# Patient Record
Sex: Male | Born: 1980 | Race: White | Hispanic: No | Marital: Married | Smoking: Former smoker
Health system: Southern US, Community
[De-identification: ages and names within clinical notes are randomized; demographics above are authoritative.]

## PROBLEM LIST (undated history)

## (undated) DIAGNOSIS — E039 Hypothyroidism, unspecified: Secondary | ICD-10-CM

## (undated) DIAGNOSIS — F419 Anxiety disorder, unspecified: Secondary | ICD-10-CM

## (undated) DIAGNOSIS — E291 Testicular hypofunction: Secondary | ICD-10-CM

## (undated) DIAGNOSIS — E063 Autoimmune thyroiditis: Secondary | ICD-10-CM

## (undated) DIAGNOSIS — K449 Diaphragmatic hernia without obstruction or gangrene: Secondary | ICD-10-CM

## (undated) HISTORY — PX: OTHER SURGICAL HISTORY: SHX169

## (undated) HISTORY — DX: Testicular hypofunction: E29.1

## (undated) HISTORY — DX: Hypothyroidism, unspecified: E03.9

---

## 2002-11-09 ENCOUNTER — Emergency Department (HOSPITAL_COMMUNITY): Admission: EM | Admit: 2002-11-09 | Discharge: 2002-11-09 | Payer: Self-pay | Admitting: Emergency Medicine

## 2004-10-19 ENCOUNTER — Emergency Department (HOSPITAL_COMMUNITY): Admission: EM | Admit: 2004-10-19 | Discharge: 2004-10-19 | Payer: Self-pay | Admitting: *Deleted

## 2011-07-25 HISTORY — PX: WISDOM TOOTH EXTRACTION: SHX21

## 2011-09-15 ENCOUNTER — Ambulatory Visit
Admission: RE | Admit: 2011-09-15 | Discharge: 2011-09-15 | Disposition: A | Discharge status: Home / Self Care | Payer: BC Managed Care – PPO | Source: Ambulatory Visit | Attending: Neurology | Admitting: Neurology

## 2011-09-15 ENCOUNTER — Other Ambulatory Visit: Payer: Self-pay | Admitting: Neurology

## 2011-09-15 DIAGNOSIS — M542 Cervicalgia: Secondary | ICD-10-CM

## 2012-03-15 ENCOUNTER — Other Ambulatory Visit (HOSPITAL_COMMUNITY): Payer: Self-pay | Admitting: "Endocrinology

## 2012-03-15 ENCOUNTER — Ambulatory Visit (HOSPITAL_COMMUNITY)
Admission: RE | Admit: 2012-03-15 | Discharge: 2012-03-15 | Disposition: A | Discharge status: Home / Self Care | Payer: BC Managed Care – PPO | Source: Ambulatory Visit | Attending: "Endocrinology | Admitting: "Endocrinology

## 2012-03-15 DIAGNOSIS — N5089 Other specified disorders of the male genital organs: Secondary | ICD-10-CM

## 2012-03-15 DIAGNOSIS — N508 Other specified disorders of male genital organs: Secondary | ICD-10-CM | POA: Insufficient documentation

## 2013-09-10 ENCOUNTER — Encounter (HOSPITAL_COMMUNITY): Payer: Self-pay | Admitting: Pharmacy Technician

## 2013-09-10 ENCOUNTER — Ambulatory Visit (INDEPENDENT_AMBULATORY_CARE_PROVIDER_SITE_OTHER): Payer: BC Managed Care – PPO | Admitting: Gastroenterology

## 2013-09-10 ENCOUNTER — Encounter (INDEPENDENT_AMBULATORY_CARE_PROVIDER_SITE_OTHER): Payer: Self-pay

## 2013-09-10 ENCOUNTER — Encounter: Payer: Self-pay | Admitting: Gastroenterology

## 2013-09-10 ENCOUNTER — Ambulatory Visit: Payer: BC Managed Care – PPO | Admitting: Gastroenterology

## 2013-09-10 VITALS — BP 115/63 | HR 79 | Temp 98.2°F | Ht 75.0 in | Wt 172.2 lb

## 2013-09-10 DIAGNOSIS — R634 Abnormal weight loss: Secondary | ICD-10-CM

## 2013-09-10 DIAGNOSIS — K625 Hemorrhage of anus and rectum: Secondary | ICD-10-CM

## 2013-09-10 DIAGNOSIS — K219 Gastro-esophageal reflux disease without esophagitis: Secondary | ICD-10-CM | POA: Insufficient documentation

## 2013-09-10 DIAGNOSIS — R198 Other specified symptoms and signs involving the digestive system and abdomen: Secondary | ICD-10-CM

## 2013-09-10 DIAGNOSIS — R6881 Early satiety: Secondary | ICD-10-CM

## 2013-09-10 DIAGNOSIS — K5900 Constipation, unspecified: Secondary | ICD-10-CM

## 2013-09-10 MED ORDER — PEG 3350-KCL-NA BICARB-NACL 420 G PO SOLR: 4000.0000 mL | ORAL | Status: DC

## 2013-09-10 NOTE — Progress Notes (Signed)
Primary Care Physician:  No primary provider on file.  Primary Gastroenterologist:  Roetta SessionsMichael Rourk, MD   Chief Complaint  Patient presents with  . Gastrophageal Reflux  . Constipation  . Weight Loss    HPI:  Johnny Wang is a 33 y.o. male here as new patient referral by Dr. Fransico HimNida, for chronic constipation and weight loss. Weighed 181 pounds at Dr. Isidoro DonningNida's on 08/13/13. Weighed 227 pounds four years ago. Today down to 172.  Four years ago multiple steed being. He required Solu-Medrol. Was on antibiotic at the same time as well. States he had a reaction. Describes feeling like he was on a "bad drug trip" for 3-4 days. Describes hallucinations and vomiting. Ever since then has had problems with losing weight, anxiety. States he was given contaminated Solu-Medrol. Since that time he had transient hypogonadism. Reports his testosterone level improved on its own. Recently had a bunch of labs done by homeopathic provider and was determined to have Hashimoto's thyroiditis. He went back to see Dr. Fransico HimNida and subsequently has been started on low-dose Synthroid. Again his weight is down 9 pounds since that time.   Symptomatically he complains of PP early satiety, heartburn (especially at lunch and supper). He feels like he doesn't digest his food very well. He describes himself as an extreme health food fanatic. He states he has always been that way never been a junkfood eater. He works out several times per week. States he's not able to gain any weight or muscle mass. Feels fatigued and weak all the time. He has been on a gluten-free/dairy free diet for about 2 weeks. He has noticed that his bowel movements have become more regular, chronically constipated usually.  Last four days good BM. Prior BM every 3-4 days. No melena, brbpr. Except with straining/paper. No dysphagia. Probiotics didn't help. Complains of left upper quadrant discomfort, empty feeling. Complains of terrible heartburn, TUMS to make it worse. He  has a lot of gas. Also wonders if symptoms are related to a trip to [place]. States symptoms seem to have started several weeks afterwards.    Current Outpatient Prescriptions  Medication Sig Dispense Refill  . levothyroxine (SYNTHROID, LEVOTHROID) 25 MCG tablet Take 25 mcg by mouth daily before breakfast.        No current facility-administered medications for this visit.    Allergies as of 09/10/2013  . (No Known Allergies)  solumedrol: infection/meningitis  Past Medical History  Diagnosis Date  . Hypothyroidism   . Hypogonadism, male     Past Surgical History  Procedure Laterality Date  . None      Family History  Problem Relation Age of Onset  . Colon cancer Neg Hx   . Inflammatory bowel disease Neg Hx   . Celiac disease Neg Hx     History   Social History  . Marital Status: Single    Spouse Name: N/A    Number of Children: 1  . Years of Education: N/A   Occupational History  . sales    Social History Main Topics  . Smoking status: Never Smoker   . Smokeless tobacco: Not on file  . Alcohol Use: No  . Drug Use: No  . Sexual Activity: Not on file   Other Topics Concern  . Not on file   Social History Narrative  . No narrative on file      ROS:  General: Negative for anorexia, fever, chills. See hpi. Eyes: Negative for vision changes.  ENT: Negative  for hoarseness, difficulty swallowing , nasal congestion. CV: Negative for chest pain, angina, palpitations, dyspnea on exertion, peripheral edema.  Respiratory: Negative for dyspnea at rest, dyspnea on exertion, cough, sputum, wheezing.  GI: See history of present illness. GU:  Negative for dysuria, hematuria, urinary incontinence, urinary frequency, nocturnal urination.  MS: Negative for joint pain, low back pain.  Derm: Negative for rash or itching.  Neuro: Negative for weakness, abnormal sensation, seizure, frequent headaches, memory loss, confusion.  Psych: c/o anxiety, depression. no suicidal  ideation, hallucinations. Feels like people think he is going crazy.  Endo: see hpi  Heme: Negative for bruising or bleeding. Allergy: Negative for rash or hives.    Physical Examination:  BP 115/63  Pulse 79  Temp(Src) 98.2 F (36.8 C) (Oral)  Ht 6\' 3"  (1.905 m)  Wt 172 lb 3.2 oz (78.109 kg)  BMI 21.52 kg/m2   General: Well-nourished, well-developed in no acute distress.  Head: Normocephalic, atraumatic.   Eyes: Conjunctiva pink, no icterus. Mouth: Oropharyngeal mucosa moist and pink , no lesions erythema or exudate. Neck: Supple without thyromegaly, masses, or lymphadenopathy.  Lungs: Clear to auscultation bilaterally.  Heart: Regular rate and rhythm, no murmurs rubs or gallops.  Abdomen: Bowel sounds are normal, nontender, nondistended, no hepatosplenomegaly or masses, no abdominal bruits or    hernia , no rebound or guarding.   Rectal: not performed Extremities: No lower extremity edema. No clubbing or deformities.  Neuro: Alert and oriented x 4 , grossly normal neurologically.  Skin: Warm and dry, no rash or jaundice.   Psych: Alert and cooperative, normal mood and affect.  Labs: Fasting a.m. cortisol level 16, normal  Imaging Studies: No results found.

## 2013-09-10 NOTE — Patient Instructions (Signed)
1. Upper endoscopy and colonoscopy with Dr. Jena Gaussourk. Please see separate instructions. 2. Please get a copy of your labs to us as soon as possible. Fax number [phone].

## 2013-09-11 ENCOUNTER — Telehealth: Payer: Self-pay | Admitting: *Deleted

## 2013-09-11 NOTE — Progress Notes (Signed)
Patient brought in a copy of his labs dated 08/01/2013 Glucose 92, hemoglobin A1c 5.2, uric acid 5.3, BUN 17, creatinine 1.08, sodium 138, calcium 9.8, total protein 6.6, albumin 5, globulin 1.6, total bilirubin 0.4, alkaline phosphatase 64, LDH 142, AST 16, ALT 14, GGT 11, iron 109, TIBC 234, iron saturations 47%, ferritin 406, white blood cell count 4800, hemoglobin 16.4, MCV 89, platelets 201,000, TSH 4.41, T4 thyroxine 6.8, free T4-1.07, TPO antibodies 28, thyroglobulin antibody 1.5, vitamin B12 550,  Vitamin D 25-28, CRP 0.21, homocystine 8.3.  

## 2013-09-11 NOTE — Assessment & Plan Note (Addendum)
33 year old gentleman with complicated medical history with multiple symptoms which have yet to be explained. Most impressive is that of a 50 pound weight loss over the past 4 years. He has lost 10 pounds since last month alone. GI symptoms consist of early satiety, chronic constipation, left upper quadrant discomfort, significant heartburn.   History of transient hypogonadism as outlined above. Most recently determined to have Hashimoto's thyroiditis and put on low dose Synthroid last month. History of vitamin D deficiency as well. He describes himself as a extremely healthy eater but denies any significant dietary changes over the course of time in which he has been losing weight. He complains of fatigue. States the symptoms have provoked significant anxiety for him. He was able to get a copy of his labs to us which indicated multiple abnormalities including low normal total protein, high normal iron saturation and high normal ferritin, low normal white blood cell count.  GI evaluation to consist of EGD and colonoscopy at this time for UGI symptoms, weight loss, bowel issues.  I have discussed the risks, alternatives, benefits with regards to but not limited to the risk of reaction to medication, bleeding, infection, perforation and the patient is agreeable to proceed. Written consent to be obtained.  We will check celiac serologies before he gets too far into gluten free diet. Along with that, we will check his LFT, ferritin, iron/tibc fasting, CBC. Further testing as indicated. If nothing to explain weight loss, he will required CT A/P, CXR as next step. Patient wanted to hold on PPI until after procedures.

## 2013-09-11 NOTE — Telephone Encounter (Signed)
Spoke with Johnny Wang- explained to him that most stool studies are only done if the patient is having diarrhea. Johnny Wang is concerned about hpylori and gardia and wanted to know if that was something we could test him for prior to him having procedures. Or any other stool studies that he can do, he doesn't mind doing them.

## 2013-09-11 NOTE — Telephone Encounter (Signed)
Pt called wanting to see about getting a stool sample done first before his colonoscopy, pt's TCS is march 4th with [facility], pt said he don't want to have a colonoscopy if he don't have too, please advise [phone]

## 2013-09-11 NOTE — Progress Notes (Signed)
Please let pt know we need him to get some more labs. Needs to be fasting!.Johnny Wang

## 2013-09-12 ENCOUNTER — Ambulatory Visit: Admit: 2013-09-12 | Payer: Self-pay | Admitting: Gastroenterology

## 2013-09-12 SURGERY — COLONOSCOPY
Anesthesia: Moderate Sedation

## 2013-09-17 NOTE — Progress Notes (Signed)
No pcp

## 2013-09-19 NOTE — Progress Notes (Signed)
Pt aware, lab orders faxed to the lab.

## 2013-09-22 LAB — CBC WITH DIFFERENTIAL/PLATELET
BASOS ABS: 0 10*3/uL (ref 0.0–0.1)
Basophils Relative: 1 % (ref 0–1)
EOS ABS: 0.1 10*3/uL (ref 0.0–0.7)
EOS PCT: 2 % (ref 0–5)
HCT: 44.3 % (ref 39.0–52.0)
Hemoglobin: 15.5 g/dL (ref 13.0–17.0)
Lymphocytes Relative: 33 % (ref 12–46)
Lymphs Abs: 1.2 10*3/uL (ref 0.7–4.0)
MCH: 31.3 pg (ref 26.0–34.0)
MCHC: 35 g/dL (ref 30.0–36.0)
MCV: 89.3 fL (ref 78.0–100.0)
Monocytes Absolute: 0.3 10*3/uL (ref 0.1–1.0)
Monocytes Relative: 8 % (ref 3–12)
Neutro Abs: 2 10*3/uL (ref 1.7–7.7)
Neutrophils Relative %: 56 % (ref 43–77)
Platelets: 167 10*3/uL (ref 150–400)
RBC: 4.96 MIL/uL (ref 4.22–5.81)
RDW: 13.1 % (ref 11.5–15.5)
WBC: 3.6 10*3/uL — ABNORMAL LOW (ref 4.0–10.5)

## 2013-09-23 LAB — TISSUE TRANSGLUTAMINASE, IGA: Tissue Transglutaminase Ab, IgA: 2.5 U/mL (ref <20)

## 2013-09-23 LAB — HEPATIC FUNCTION PANEL
ALBUMIN: 4.9 g/dL (ref 3.5–5.2)
ALT: 18 U/L (ref 0–53)
AST: 15 U/L (ref 0–37)
Alkaline Phosphatase: 55 U/L (ref 39–117)
Bilirubin, Direct: 0.1 mg/dL (ref 0.0–0.3)
Indirect Bilirubin: 0.4 mg/dL (ref 0.2–1.2)
TOTAL PROTEIN: 6.5 g/dL (ref 6.0–8.3)
Total Bilirubin: 0.5 mg/dL (ref 0.2–1.2)

## 2013-09-23 LAB — IRON AND TIBC
%SAT: 54 % (ref 20–55)
Iron: 136 ug/dL (ref 42–165)
TIBC: 250 ug/dL (ref 215–435)
UIBC: 114 ug/dL — AB (ref 125–400)

## 2013-09-23 LAB — IGA: IGA: 143 mg/dL (ref 68–379)

## 2013-09-23 LAB — FERRITIN: Ferritin: 284 ng/mL (ref 22–322)

## 2013-09-24 ENCOUNTER — Ambulatory Visit (HOSPITAL_COMMUNITY)
Admission: RE | Admit: 2013-09-24 | Discharge: 2013-09-24 | Disposition: A | Discharge status: Home / Self Care | Payer: BC Managed Care – PPO | Source: Ambulatory Visit | Attending: Internal Medicine | Admitting: Internal Medicine

## 2013-09-24 ENCOUNTER — Encounter (HOSPITAL_COMMUNITY): Payer: Self-pay | Admitting: *Deleted

## 2013-09-24 ENCOUNTER — Encounter (HOSPITAL_COMMUNITY)
Admission: RE | Disposition: A | Discharge status: Home / Self Care | Payer: Self-pay | Source: Ambulatory Visit | Attending: Internal Medicine

## 2013-09-24 DIAGNOSIS — R634 Abnormal weight loss: Secondary | ICD-10-CM

## 2013-09-24 DIAGNOSIS — K921 Melena: Secondary | ICD-10-CM | POA: Insufficient documentation

## 2013-09-24 DIAGNOSIS — R6881 Early satiety: Secondary | ICD-10-CM

## 2013-09-24 DIAGNOSIS — K5900 Constipation, unspecified: Secondary | ICD-10-CM

## 2013-09-24 DIAGNOSIS — K2100 Gastro-esophageal reflux disease with esophagitis, without bleeding: Secondary | ICD-10-CM | POA: Insufficient documentation

## 2013-09-24 DIAGNOSIS — K219 Gastro-esophageal reflux disease without esophagitis: Secondary | ICD-10-CM

## 2013-09-24 DIAGNOSIS — K449 Diaphragmatic hernia without obstruction or gangrene: Secondary | ICD-10-CM | POA: Insufficient documentation

## 2013-09-24 DIAGNOSIS — K625 Hemorrhage of anus and rectum: Secondary | ICD-10-CM

## 2013-09-24 DIAGNOSIS — R198 Other specified symptoms and signs involving the digestive system and abdomen: Secondary | ICD-10-CM

## 2013-09-24 HISTORY — PX: COLONOSCOPY WITH ESOPHAGOGASTRODUODENOSCOPY (EGD): SHX5779

## 2013-09-24 SURGERY — COLONOSCOPY WITH ESOPHAGOGASTRODUODENOSCOPY (EGD)
Anesthesia: Moderate Sedation

## 2013-09-24 MED ORDER — MIDAZOLAM HCL 5 MG/5ML IJ SOLN
INTRAMUSCULAR | Status: DC | PRN
  Administered 2013-09-24 (×2): 1 mg via INTRAVENOUS
  Administered 2013-09-24: 2 mg via INTRAVENOUS
  Administered 2013-09-24: 1 mg via INTRAVENOUS

## 2013-09-24 MED ORDER — STERILE WATER FOR IRRIGATION IR SOLN
Status: DC | PRN
  Administered 2013-09-24: 09:00:00

## 2013-09-24 MED ORDER — MEPERIDINE HCL 100 MG/ML IJ SOLN
INTRAMUSCULAR | Status: DC | PRN
  Administered 2013-09-24: 50 mg via INTRAVENOUS
  Administered 2013-09-24: 25 mg via INTRAVENOUS

## 2013-09-24 MED ORDER — LIDOCAINE VISCOUS 2 % MT SOLN
OROMUCOSAL | Status: DC | PRN
Start: 2013-09-24 — End: 2013-09-24
  Administered 2013-09-24: 3 mL via OROMUCOSAL

## 2013-09-24 MED ORDER — SODIUM CHLORIDE 0.9 % IV SOLN
INTRAVENOUS | Status: DC
  Administered 2013-09-24: 1000 mL via INTRAVENOUS

## 2013-09-24 MED ORDER — MEPERIDINE HCL 100 MG/ML IJ SOLN
INTRAMUSCULAR | Status: AC
  Filled 2013-09-24: qty 2

## 2013-09-24 MED ORDER — ONDANSETRON HCL 4 MG/2ML IJ SOLN
INTRAMUSCULAR | Status: DC | PRN
  Administered 2013-09-24: 4 mg via INTRAVENOUS

## 2013-09-24 MED ORDER — PROMETHAZINE HCL 25 MG/ML IJ SOLN
INTRAMUSCULAR | Status: AC
  Filled 2013-09-24: qty 1

## 2013-09-24 MED ORDER — MIDAZOLAM HCL 5 MG/5ML IJ SOLN
INTRAMUSCULAR | Status: AC
  Filled 2013-09-24: qty 10

## 2013-09-24 MED ORDER — LIDOCAINE VISCOUS 2 % MT SOLN
OROMUCOSAL | Status: AC
  Filled 2013-09-24: qty 15

## 2013-09-24 MED ORDER — PROMETHAZINE HCL 25 MG/ML IJ SOLN
25.0000 mg | Freq: Once | INTRAMUSCULAR | Status: AC
  Administered 2013-09-24: 25 mg via INTRAVENOUS

## 2013-09-24 MED ORDER — SODIUM CHLORIDE 0.9 % IJ SOLN
INTRAMUSCULAR | Status: AC
  Filled 2013-09-24: qty 10

## 2013-09-24 MED ORDER — ONDANSETRON HCL 4 MG/2ML IJ SOLN
INTRAMUSCULAR | Status: AC
  Filled 2013-09-24: qty 2

## 2013-09-24 NOTE — Interval H&P Note (Signed)
History and Physical Interval Note:  09/24/2013 8:43 AM  Johnny SicilianAustin P Wang  has presented today for surgery, with the diagnosis of EARLY SATIETY, RECTAL BLEEDING, GERD AND WEIGHT LOSS  The various methods of treatment have been discussed with the patient and family. After consideration of risks, benefits and other options for treatment, the patient has consented to  Procedure(s) with comments: COLONOSCOPY WITH ESOPHAGOGASTRODUODENOSCOPY (EGD) (N/A) - 830 as a surgical intervention .  The patient's history has been reviewed, patient examined, no change in status, stable for surgery.  I have reviewed the patient's chart and labs.  Questions were answered to the patient's satisfaction.       [name]

## 2013-09-24 NOTE — Interval H&P Note (Signed)
History and Physical Interval Note:  09/24/2013 8:49 AM  Johnny Wang  has presented today for surgery, with the diagnosis of EARLY SATIETY, RECTAL BLEEDING, GERD AND WEIGHT LOSS  The various methods of treatment have been discussed with the patient and family. After consideration of risks, benefits and other options for treatment, the patient has consented to  Procedure(s) with comments: COLONOSCOPY WITH ESOPHAGOGASTRODUODENOSCOPY (EGD) (N/A) - 830 as a surgical intervention .  The patient's history has been reviewed, patient examined, no change in status, stable for surgery.  I have reviewed the patient's chart and labs.  Questions were answered to the patient's satisfaction.    No change. EGD and colonoscopy per plan.The risks, benefits, limitations, imponderables and alternatives regarding both EGD and colonoscopy have been reviewed with the patient. Questions have been answered. All parties agreeable.   Johnny Wang

## 2013-09-24 NOTE — H&P (View-Only) (Signed)
Patient brought in a copy of his labs dated 08/01/2013 Glucose 92, hemoglobin A1c 5.2, uric acid 5.3, BUN 17, creatinine 1.08, sodium 138, calcium 9.8, total protein 6.6, albumin 5, globulin 1.6, total bilirubin 0.4, alkaline phosphatase 64, LDH 142, AST 16, ALT 14, GGT 11, iron 109, TIBC 234, iron saturations 47%, ferritin 406, white blood cell count 4800, hemoglobin 16.4, MCV 89, platelets 201,000, TSH 4.41, T4 thyroxine 6.8, free T4-1.07, TPO antibodies 28, thyroglobulin antibody 1.5, vitamin B12 550,  Vitamin D 25-28, CRP 0.21, homocystine 8.3.

## 2013-09-24 NOTE — H&P (View-Only) (Signed)
Primary Care Physician:  No primary provider on file.  Primary Gastroenterologist:  Roetta SessionsMichael Rourk, MD   Chief Complaint  Patient presents with  . Gastrophageal Reflux  . Constipation  . Weight Loss    HPI:  Johnny Wang is a 33 y.o. male here as new patient referral by Dr. Fransico HimNida, for chronic constipation and weight loss. Weighed 181 pounds at Dr. Isidoro DonningNida's on 08/13/13. Weighed 227 pounds four years ago. Today down to 172.  Four years ago multiple steed being. He required Solu-Medrol. Was on antibiotic at the same time as well. States he had a reaction. Describes feeling like he was on a "bad drug trip" for 3-4 days. Describes hallucinations and vomiting. Ever since then has had problems with losing weight, anxiety. States he was given contaminated Solu-Medrol. Since that time he had transient hypogonadism. Reports his testosterone level improved on its own. Recently had a bunch of labs done by homeopathic provider and was determined to have Hashimoto's thyroiditis. He went back to see Dr. Fransico HimNida and subsequently has been started on low-dose Synthroid. Again his weight is down 9 pounds since that time.   Symptomatically he complains of PP early satiety, heartburn (especially at lunch and supper). He feels like he doesn't digest his food very well. He describes himself as an extreme health food fanatic. He states he has always been that way never been a junkfood eater. He works out several times per week. States he's not able to gain any weight or muscle mass. Feels fatigued and weak all the time. He has been on a gluten-free/dairy free diet for about 2 weeks. He has noticed that his bowel movements have become more regular, chronically constipated usually.  Last four days good BM. Prior BM every 3-4 days. No melena, brbpr. Except with straining/paper. No dysphagia. Probiotics didn't help. Complains of left upper quadrant discomfort, empty feeling. Complains of terrible heartburn, TUMS to make it worse. He  has a lot of gas. Also wonders if symptoms are related to a trip to [place]. States symptoms seem to have started several weeks afterwards.    Current Outpatient Prescriptions  Medication Sig Dispense Refill  . levothyroxine (SYNTHROID, LEVOTHROID) 25 MCG tablet Take 25 mcg by mouth daily before breakfast.        No current facility-administered medications for this visit.    Allergies as of 09/10/2013  . (No Known Allergies)  solumedrol: infection/meningitis  Past Medical History  Diagnosis Date  . Hypothyroidism   . Hypogonadism, male     Past Surgical History  Procedure Laterality Date  . None      Family History  Problem Relation Age of Onset  . Colon cancer Neg Hx   . Inflammatory bowel disease Neg Hx   . Celiac disease Neg Hx     History   Social History  . Marital Status: Single    Spouse Name: N/A    Number of Children: 1  . Years of Education: N/A   Occupational History  . sales    Social History Main Topics  . Smoking status: Never Smoker   . Smokeless tobacco: Not on file  . Alcohol Use: No  . Drug Use: No  . Sexual Activity: Not on file   Other Topics Concern  . Not on file   Social History Narrative  . No narrative on file      ROS:  General: Negative for anorexia, fever, chills. See hpi. Eyes: Negative for vision changes.  ENT: Negative  for hoarseness, difficulty swallowing , nasal congestion. CV: Negative for chest pain, angina, palpitations, dyspnea on exertion, peripheral edema.  Respiratory: Negative for dyspnea at rest, dyspnea on exertion, cough, sputum, wheezing.  GI: See history of present illness. GU:  Negative for dysuria, hematuria, urinary incontinence, urinary frequency, nocturnal urination.  MS: Negative for joint pain, low back pain.  Derm: Negative for rash or itching.  Neuro: Negative for weakness, abnormal sensation, seizure, frequent headaches, memory loss, confusion.  Psych: c/o anxiety, depression. no suicidal  ideation, hallucinations. Feels like people think he is going crazy.  Endo: see hpi  Heme: Negative for bruising or bleeding. Allergy: Negative for rash or hives.    Physical Examination:  BP 115/63  Pulse 79  Temp(Src) 98.2 F (36.8 C) (Oral)  Ht 6\' 3"  (1.905 m)  Wt 172 lb 3.2 oz (78.109 kg)  BMI 21.52 kg/m2   General: Well-nourished, well-developed in no acute distress.  Head: Normocephalic, atraumatic.   Eyes: Conjunctiva pink, no icterus. Mouth: Oropharyngeal mucosa moist and pink , no lesions erythema or exudate. Neck: Supple without thyromegaly, masses, or lymphadenopathy.  Lungs: Clear to auscultation bilaterally.  Heart: Regular rate and rhythm, no murmurs rubs or gallops.  Abdomen: Bowel sounds are normal, nontender, nondistended, no hepatosplenomegaly or masses, no abdominal bruits or    hernia , no rebound or guarding.   Rectal: not performed Extremities: No lower extremity edema. No clubbing or deformities.  Neuro: Alert and oriented x 4 , grossly normal neurologically.  Skin: Warm and dry, no rash or jaundice.   Psych: Alert and cooperative, normal mood and affect.  Labs: Fasting a.m. cortisol level 16, normal  Imaging Studies: No results found.

## 2013-09-24 NOTE — Op Note (Signed)
[facility] [address] [place], [id number]   COLONOSCOPY PROCEDURE REPORT  PATIENT: [name], Johnny P.  MR#:         [id number] BIRTHDATE: 11/26/1980 , 32  yrs. old GENDER: Male ENDOSCOPIST: R.  Roetta SessionsMichael Nyella Eckels, MD FACP FACG REFERRED BY:  Darla LeschesGebreselassic Nida, M.D. PROCEDURE DATE:  09/24/2013 PROCEDURE:     Diagnostic ileocolonoscopy  INDICATIONS: Hematochezia  INFORMED CONSENT:  The risks, benefits, alternatives and imponderables including but not limited to bleeding, perforation as well as the possibility of a missed lesion have been reviewed.  The potential for biopsy, lesion removal, etc. have also been discussed.  Questions have been answered.  All parties agreeable. Please see the history and physical in the medical record for more information.  MEDICATIONS: Versed 5 mg IV and Demerol 75 mg IV and Zofran 4 mg IV. Phenergan 25 mg IV  DESCRIPTION OF PROCEDURE:  After a digital rectal exam was perforcathmed, the EC-[id number] ([id number]  colonoscope was advanced from the anus through the rectum and colon to the area of the cecum, ileocecal valve and appendiceal orifice.  The cecum was deeply intubated.  These structures were well-seen and photographed for the record.  From the level of the cecum and ileocecal valve, the scope was slowly and cautiously withdrawn.  The mucosal surfaces were carefully surveyed utilizing scope tip deflection to facilitate fold flattening as needed.  The scope was pulled down into the rectum where a thorough examination was performed.    FINDINGS:  Friable anal canal; otherwise, normal rectum. Normal appearing colonic mucosa.. Normal-appearing distal10 cm of terminal ileal mucosa. THERAPEUTIC / DIAGNOSTIC MANEUVERS PERFORMED:  none  COMPLICATIONS: None  CECAL WITHDRAWAL TIME:  9 minutes  IMPRESSION:  Friable anal canal; otherwise, normal colonoscopy  RECOMMENDATIONS: Course of Anusol suppositories. See EGD  report.   _______________________________ eSigned:  R. Roetta SessionsMichael Diem Pagnotta, MD FACP [facility] 09/24/2013 9:27 AM   CC:

## 2013-09-24 NOTE — Op Note (Signed)
[facility] [address] [place], [id number]   ENDOSCOPY PROCEDURE REPORT  PATIENT: Johnny Wang, Johnny P.  MR#: [id number] BIRTHDATE: 1981/02/27 , 32  yrs. old GENDER: Male ENDOSCOPIST: R.  Roetta SessionsMichael Yu Peggs, MD FACP FACG REFERRED BY:  Darla LeschesGebreselassic Nida, M.D. PROCEDURE DATE:  09/24/2013 PROCEDURE:     EGD with duodenal biopsy  INDICATIONS:      GERD; weight loss  INFORMED CONSENT:   The risks, benefits, limitations, alternatives and imponderables have been discussed.  The potential for biopsy, esophogeal dilation, etc. have also been reviewed.  Questions have been answered.  All parties agreeable.  Please see the history and physical in the medical record for more information.  MEDICATIONS:   Versed 3 mg IV and Demerol 75 mg IV in divided doses. Zofran 4 mg IV. Xylocaine gel orally.  DESCRIPTION OF PROCEDURE:   The EG-[id number] ([id number]  endoscope was introduced through the mouth and advanced to the second portion of the duodenum without difficulty or limitations.  The mucosal surfaces were surveyed very carefully during advancement of the scope and upon withdrawal.  Retroflexion view of the proximal stomach and esophagogastric junction was performed.      FINDINGS: Mucosal breaks at the GE junction - largest  3 mm in dimensions consistent with reflux esophagitis. No Barrett's esophagus.  Stomach empty. Small hiatal hernia. Normal gastric mucosa. Patent pylorus. Normal-appearing first, second and third portion of the duodenum.  THERAPEUTIC / DIAGNOSTIC MANEUVERS PERFORMED:  Biopsies of the second and third portion duodenum taken   COMPLICATIONS:  None  IMPRESSION:   Erosive reflux esophagitis (mild). Small hiatal hernia. Status post duodenal biopsy  RECOMMENDATIONS: Trial of Protonix 40 mg daily.  Followup on pathology. See colonoscopy report.    _______________________________ R. Roetta SessionsMichael Myrtis Maille, MD FACP [facility] eSigned:  R. Roetta SessionsMichael Kathee Tumlin, MD FACP [facility]  09/24/2013 9:07 AM     CC:  PATIENT NAME:  Johnny Wang, Johnny P. MR#: [id number]

## 2013-09-24 NOTE — Discharge Instructions (Addendum)
Colonoscopy Discharge Instructions  Read the instructions outlined below and refer to this sheet in the next few weeks. These discharge instructions provide you with general information on caring for yourself after you leave the hospital. Your doctor may also give you specific instructions. While your treatment has been planned according to the most current medical practices available, unavoidable complications occasionally occur. If you have any problems or questions after discharge, call Dr. Gala Romney at [phone]. ACTIVITY  You may resume your regular activity, but move at a slower pace for the next 24 hours.   Take frequent rest periods for the next 24 hours.   Walking will help get rid of the air and reduce the bloated feeling in your belly (abdomen).   No driving for 24 hours (because of the medicine (anesthesia) used during the test).    Do not sign any important legal documents or operate any machinery for 24 hours (because of the anesthesia used during the test).  NUTRITION  Drink plenty of fluids.   You may resume your normal diet as instructed by your doctor.   Begin with a light meal and progress to your normal diet. Heavy or fried foods are harder to digest and may make you feel sick to your stomach (nauseated).   Avoid alcoholic beverages for 24 hours or as instructed.  MEDICATIONS  You may resume your normal medications unless your doctor tells you otherwise.  WHAT YOU CAN EXPECT TODAY  Some feelings of bloating in the abdomen.   Passage of more gas than usual.   Spotting of blood in your stool or on the toilet paper.  IF YOU HAD POLYPS REMOVED DURING THE COLONOSCOPY:  No aspirin products for 7 days or as instructed.   No alcohol for 7 days or as instructed.   Eat a soft diet for the next 24 hours.  FINDING OUT THE RESULTS OF YOUR TEST Not all test results are available during your visit. If your test results are not back during the visit, make an appointment  with your caregiver to find out the results. Do not assume everything is normal if you have not heard from your caregiver or the medical facility. It is important for you to follow up on all of your test results.  SEEK IMMEDIATE MEDICAL ATTENTION IF:  You have more than a spotting of blood in your stool.   Your belly is swollen (abdominal distention).   You are nauseated or vomiting.   You have a temperature over 101.  You have abdominal pain or discomfort that is severe or gets worse throughout the day. EGD Discharge instructions Please read the instructions outlined below and refer to this sheet in the next few weeks. These discharge instructions provide you with general information on caring for yourself after you leave the hospital. Your doctor may also give you specific instructions. While your treatment has been planned according to the most current medical practices available, unavoidable complications occasionally occur. If you have any problems or questions after discharge, please call your doctor. ACTIVITY You may resume your regular activity but move at a slower pace for the next 24 hours.  Take frequent rest periods for the next 24 hours.  Walking will help expel (get rid of) the air and reduce the bloated feeling in your abdomen.  No driving for 24 hours (because of the anesthesia (medicine) used during the test).  You may shower.  Do not sign any important legal documents or operate any machinery for 24  hours (because of the anesthesia used during the test).  NUTRITION Drink plenty of fluids.  You may resume your normal diet.  Begin with a light meal and progress to your normal diet.  Avoid alcoholic beverages for 24 hours or as instructed by your caregiver.  MEDICATIONS You may resume your normal medications unless your caregiver tells you otherwise.  WHAT YOU CAN EXPECT TODAY You may experience abdominal discomfort such as a feeling of fullness or gas pains.   FOLLOW-UP Your doctor will discuss the results of your test with you.  SEEK IMMEDIATE MEDICAL ATTENTION IF ANY OF THE FOLLOWING OCCUR: Excessive nausea (feeling sick to your stomach) and/or vomiting.  Severe abdominal pain and distention (swelling).  Trouble swallowing.  Temperature over 101 F (37.8 C).  Rectal bleeding or vomiting of blood.      GERD information.  Begin Protonix 40 mg daily  Ten-day course of Anusol suppositories  Office visit with [place] in 6 weeks to check weight, etc.  Further recommendations to follow pending review of pathology report    Diet for Gastroesophageal Reflux Disease, Adult Reflux (acid reflux) is when acid from your stomach flows up into the esophagus. When acid comes in contact with the esophagus, the acid causes irritation and soreness (inflammation) in the esophagus. When reflux happens often or so severely that it causes damage to the esophagus, it is called gastroesophageal reflux disease (GERD). Nutrition therapy can help ease the discomfort of GERD. FOODS OR DRINKS TO AVOID OR LIMIT Smoking or chewing tobacco. Nicotine is one of the most potent stimulants to acid production in the gastrointestinal tract. Caffeinated and decaffeinated coffee and black tea. Regular or low-calorie carbonated beverages or energy drinks (caffeine-free carbonated beverages are allowed).  Strong spices, such as black pepper, white pepper, red pepper, cayenne, curry powder, and chili powder. Peppermint or spearmint. Chocolate. High-fat foods, including meats and fried foods. Extra added fats including oils, butter, salad dressings, and nuts. Limit these to less than 8 tsp per day. Fruits and vegetables if they are not tolerated, such as citrus fruits or tomatoes. Alcohol. Any food that seems to aggravate your condition. If you have questions regarding your diet, call your caregiver or a registered dietitian. OTHER THINGS THAT MAY HELP GERD INCLUDE:  Eating  your meals slowly, in a relaxed setting. Eating 5 to 6 small meals per day instead of 3 large meals. Eliminating food for a period of time if it causes distress. Not lying down until 3 hours after eating a meal. Keeping the head of your bed raised 6 to 9 inches (15 to 23 cm) by using a foam wedge or blocks under the legs of the bed. Lying flat may make symptoms worse. Being physically active. Weight loss may be helpful in reducing reflux in overweight or obese adults. Wear loose fitting clothing EXAMPLE MEAL PLAN This meal plan is approximately 2,000 calories based on https://www.bernard.org/ meal planning guidelines. Breakfast  cup cooked oatmeal. 1 cup strawberries. 1 cup low-fat milk. 1 oz almonds. Snack 1 cup cucumber slices. 6 oz yogurt (made from low-fat or fat-free milk). Lunch 2 slice whole-wheat bread. 2 oz sliced [place]. 2 tsp mayonnaise. 1 cup blueberries. 1 cup snap peas. Snack 6 whole-wheat crackers. 1 oz string cheese. Dinner  cup brown rice. 1 cup mixed veggies. 1 tsp olive oil. 3 oz grilled fish. Document Released: 07/10/2005 Document Revised: 10/02/2011 Document Reviewed: 05/26/2011 [facility] Patient Information 2014 [place], [place]. Gastroesophageal Reflux Disease, Adult Gastroesophageal reflux disease (GERD) happens when  acid from your stomach flows up into the esophagus. When acid comes in contact with the esophagus, the acid causes soreness (inflammation) in the esophagus. Over time, GERD may create small holes (ulcers) in the lining of the esophagus. CAUSES  Increased body weight. This puts pressure on the stomach, making acid rise from the stomach into the esophagus. Smoking. This increases acid production in the stomach. Drinking alcohol. This causes decreased pressure in the lower esophageal sphincter (valve or ring of muscle between the esophagus and stomach), allowing acid from the stomach into the esophagus. Late evening meals and a full stomach. This  increases pressure and acid production in the stomach. A malformed lower esophageal sphincter. Sometimes, no cause is found. SYMPTOMS  Burning pain in the lower part of the mid-chest behind the breastbone and in the mid-stomach area. This may occur twice a week or more often. Trouble swallowing. Sore throat. Dry cough. Asthma-like symptoms including chest tightness, shortness of breath, or wheezing. DIAGNOSIS  Your caregiver may be able to diagnose GERD based on your symptoms. In some cases, X-rays and other tests may be done to check for complications or to check the condition of your stomach and esophagus. TREATMENT  Your caregiver may recommend over-the-counter or prescription medicines to help decrease acid production. Ask your caregiver before starting or adding any new medicines.  HOME CARE INSTRUCTIONS  Change the factors that you can control. Ask your caregiver for guidance concerning weight loss, quitting smoking, and alcohol consumption. Avoid foods and drinks that make your symptoms worse, such as: Caffeine or alcoholic drinks. Chocolate. Peppermint or mint flavorings. Garlic and onions. Spicy foods. Citrus fruits, such as oranges, lemons, or limes. Tomato-based foods such as sauce, chili, salsa, and pizza. Fried and fatty foods. Avoid lying down for the 3 hours prior to your bedtime or prior to taking a nap. Eat small, frequent meals instead of large meals. Wear loose-fitting clothing. Do not wear anything tight around your waist that causes pressure on your stomach. Raise the head of your bed 6 to 8 inches with wood blocks to help you sleep. Extra pillows will not help. Only take over-the-counter or prescription medicines for pain, discomfort, or fever as directed by your caregiver. Do not take aspirin, ibuprofen, or other nonsteroidal anti-inflammatory drugs (NSAIDs). SEEK IMMEDIATE MEDICAL CARE IF:  You have pain in your arms, neck, jaw, teeth, or back. Your pain  increases or changes in intensity or duration. You develop nausea, vomiting, or sweating (diaphoresis). You develop shortness of breath, or you faint. Your vomit is green, yellow, black, or looks like coffee grounds or blood. Your stool is red, bloody, or black. These symptoms could be signs of other problems, such as heart disease, gastric bleeding, or esophageal bleeding. MAKE SURE YOU:  Understand these instructions. Will watch your condition. Will get help right away if you are not doing well or get worse. Document Released: 04/19/2005 Document Revised: 10/02/2011 Document Reviewed: 01/27/2011 [facility] Patient Information 2014 [place].

## 2013-09-26 ENCOUNTER — Encounter: Payer: Self-pay | Admitting: Internal Medicine

## 2013-09-29 ENCOUNTER — Encounter (HOSPITAL_COMMUNITY): Payer: Self-pay | Admitting: Internal Medicine

## 2013-10-01 NOTE — Progress Notes (Signed)
Quick Note:  Mild leukopenia. Celiac labs unremarkable. LFTs normal.  Recommend follow up OV in 6 weeks for weight loss, constipation, digestive concerns. Repeat CBC in 2 months. ______

## 2013-10-07 ENCOUNTER — Other Ambulatory Visit: Payer: Self-pay | Admitting: Gastroenterology

## 2013-10-07 DIAGNOSIS — K625 Hemorrhage of anus and rectum: Secondary | ICD-10-CM

## 2013-10-08 ENCOUNTER — Encounter: Payer: Self-pay | Admitting: Internal Medicine

## 2013-10-15 ENCOUNTER — Telehealth: Payer: Self-pay | Admitting: Internal Medicine

## 2013-10-15 NOTE — Telephone Encounter (Signed)
Patient called today to ask if there is something else RMR could prescribe for him. He doesn't know the name of what he is taking, but it isn't helping him any. Please advise. He uses [facility] in [place]. [phone]

## 2013-10-16 NOTE — Telephone Encounter (Signed)
Tried to call pt to get more information- NA. Pt is taking protonix

## 2013-10-16 NOTE — Telephone Encounter (Signed)
Information from patient telephone call today too vague upon which to make any clinical decisions.

## 2013-10-21 NOTE — Telephone Encounter (Signed)
Tried to call pt- LMOM 

## 2013-10-21 NOTE — Telephone Encounter (Signed)
Routing to RMR for recommendations.

## 2013-10-21 NOTE — Telephone Encounter (Signed)
Pt stated that the Protonix helped with the heart burn but it made him feel like he had allergies after he took it. He has never tried anything else before. Please advise

## 2013-10-22 NOTE — Telephone Encounter (Signed)
Pt is aware. #3 bottles are at the front desk.

## 2013-10-22 NOTE — Telephone Encounter (Signed)
Okay. Stop Protonix; try Zegerid 40 mg daily -  give him 14. If he does well, we can refill 30 at a time x1 year

## 2013-11-04 ENCOUNTER — Other Ambulatory Visit: Payer: Self-pay

## 2013-11-04 DIAGNOSIS — K625 Hemorrhage of anus and rectum: Secondary | ICD-10-CM

## 2013-11-17 ENCOUNTER — Encounter (INDEPENDENT_AMBULATORY_CARE_PROVIDER_SITE_OTHER): Payer: Self-pay

## 2013-11-17 ENCOUNTER — Ambulatory Visit (INDEPENDENT_AMBULATORY_CARE_PROVIDER_SITE_OTHER): Payer: BC Managed Care – PPO | Admitting: Gastroenterology

## 2013-11-17 ENCOUNTER — Encounter: Payer: Self-pay | Admitting: Gastroenterology

## 2013-11-17 VITALS — BP 95/60 | HR 65 | Temp 97.6°F | Ht 74.0 in | Wt 173.0 lb

## 2013-11-17 DIAGNOSIS — R5383 Other fatigue: Secondary | ICD-10-CM

## 2013-11-17 DIAGNOSIS — R5381 Other malaise: Secondary | ICD-10-CM

## 2013-11-17 DIAGNOSIS — K219 Gastro-esophageal reflux disease without esophagitis: Secondary | ICD-10-CM

## 2013-11-17 DIAGNOSIS — R634 Abnormal weight loss: Secondary | ICD-10-CM

## 2013-11-17 NOTE — Progress Notes (Signed)
cc'd to pcp 

## 2013-11-17 NOTE — Progress Notes (Signed)
      Primary Care Physician: Bennie PieriniMARTIN,MARY MARGARET, FNP  Primary Gastroenterologist:  Roetta SessionsMichael Rourk, MD   Chief Complaint  Patient presents with  . Follow-up    not doing good    HPI: Chong Sicilianustin P Humber is a 33 y.o. male here for followup of weight loss, GERD, change in bowel function, rectal bleeding. Last seen in February. His weight is up 1 pound since then. Ileocolonoscopy revealed friable anal canal but otherwise unremarkable. EGD revealed erosive reflux esophagitis, mild. Small hiatal hernia. Duodenal biopsy was negative.  Protonix made him feel like had allergies/ears popped. Took Zegerid for six days, feels like quesy feeling in stomach. Made him feel "worse" so he stopped. Heartburn gone. Doesn't feel like he needs med for this. No abdominal pain. Continues to feel like doesn't digest food well. Feels fatigued/weak all the time. Feels dizzy. 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 Has to make himself do everything.  BM better off gluten free diet. No melena, brbpr. 5000 calories per day and cannot gain weight.   Thinking of going to psychiatrist to start meds for anxiety.   Current Outpatient Prescriptions  Medication Sig Dispense Refill  . levothyroxine (SYNTHROID, LEVOTHROID) 25 MCG tablet Take 25 mcg by mouth daily before breakfast.       . omeprazole-sodium bicarbonate (ZEGERID) 40-1100 MG per capsule Take 1 capsule by mouth daily before breakfast.       No current facility-administered medications for this visit.    Allergies as of 11/17/2013 - Review Complete 11/17/2013  Allergen Reaction Noted  . Solu-medrol [methylprednisolone acetate] Shortness Of Breath, Nausea And Vomiting, and Other (See Comments) 09/10/2013    ROS:  General:see hpi ENT: Negative for hoarseness, difficulty swallowing , nasal congestion. CV: Negative for chest pain, angina, palpitations, dyspnea on exertion, peripheral edema.  Respiratory: Negative for dyspnea at rest, dyspnea on exertion, cough, sputum, wheezing.   GI: See history of present illness. GU:  Negative for dysuria, hematuria, urinary incontinence, urinary frequency, nocturnal urination.  Endo: see hpi  Physical Examination:   BP 95/60  Pulse 65  Temp(Src) 97.6 F (36.4 C) (Oral)  Ht 6\' 2"  (1.88 m)  Wt 173 lb (78.472 kg)  BMI 22.20 kg/m2  General: Well-nourished, well-developed in no acute distress.  Eyes: No icterus. Mouth: Oropharyngeal mucosa moist and pink , no lesions erythema or exudate. Lungs: Clear to auscultation bilaterally.  Heart: Regular rate and rhythm, no murmurs rubs or gallops.  Abdomen: Bowel sounds are normal, nontender, nondistended, no hepatosplenomegaly or masses, no abdominal bruits or hernia , no rebound or guarding.   Extremities: No lower extremity edema. No clubbing or deformities. Neuro: Alert and oriented x 4   Skin: Warm and dry, no jaundice.   Psych: Alert and cooperative, normal mood and affect.

## 2013-11-17 NOTE — Assessment & Plan Note (Signed)
Weight stabilizes for past two months but impressive weight loss over 4 years. Complains of inability to gain weight. Eats 5000+ calories per day. Multitude vague symptoms with fatigue, dizziness, anxiety. GI work up as outlined above. He did have mild leukopenia as well that we will follow up on and if remains, will send to hematologist.   Discussed possibility of CT A/P, CXR to complete our work up of weight loss. ?stool elastase. To discuss with Dr. Jena Gaussourk first, per patient request.

## 2013-11-17 NOTE — Patient Instructions (Signed)
1. I will discuss your case with Dr. Jena Gaussourk. We will call you with further recommendations.

## 2013-11-26 NOTE — Progress Notes (Signed)
Please let patient know that I discussed his case with Dr. Jena Gaussourk. -->Dr. Jena Gaussourk recommends proceeding with [facility] for second opinion regarding weight loss, bowel change, abdominal discomfort.  -->also needs repeat CBC as scheduled later this month.

## 2013-12-08 ENCOUNTER — Telehealth (HOSPITAL_COMMUNITY): Payer: Self-pay

## 2013-12-11 NOTE — Progress Notes (Signed)
Pt stated that [facility] would [facility]. The lab work is in the mail.

## 2013-12-29 ENCOUNTER — Encounter (HOSPITAL_COMMUNITY): Payer: Self-pay | Admitting: Psychiatry

## 2013-12-29 ENCOUNTER — Ambulatory Visit (INDEPENDENT_AMBULATORY_CARE_PROVIDER_SITE_OTHER): Payer: BC Managed Care – PPO | Admitting: Psychiatry

## 2013-12-29 VITALS — BP 90/58 | Ht 75.0 in | Wt 168.0 lb

## 2013-12-29 DIAGNOSIS — F411 Generalized anxiety disorder: Secondary | ICD-10-CM

## 2013-12-29 MED ORDER — ESCITALOPRAM OXALATE 10 MG PO TABS: 10.0000 mg | ORAL_TABLET | Freq: Every day | ORAL | Status: DC

## 2013-12-29 MED ORDER — LORAZEPAM 0.5 MG PO TABS: ORAL_TABLET | ORAL | Status: DC

## 2013-12-29 NOTE — Progress Notes (Signed)
Psychiatric Assessment Adult  Patient Identification:  Johnny Wang Date of Evaluation:  12/29/2013 Chief Complaint: "I get nervous a lot History of Chief Complaint:   Chief Complaint  Patient presents with  . Anxiety  . Depression  . Establish Care    Anxiety Symptoms include dizziness and nervous/anxious behavior.     this patient is a 33 year old married white male lives with his wife and 22-year-old daughter in [place]. He is a Production designer, theatre/television/film at [employer].  The patient was referred by Dr. Fransico Him, his endocrinologist, for assessment and treatment of anxiety and depression.  The patient stated his difficulty started about 4 years ago. 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 He drank 3-4 beers a night, smoke cigarettes and didn't eat properly. He began to get more and more fatigued. Eventually he was diagnosed with hypothyroidism and more recently with Hashimoto's thyroiditis. He used to weigh 227 pounds and now is down to 168 pounds. He's also had a lot of stomach problems. Along with this came panic attacks anxiety and a feeling of dissatisfaction and mild depression. He denies suicidal ideation or psychotic symptoms  The patient is now on Synthroid but he still doesn't feel right. He's not able to gain weight. He went on a gluten-free diet because it thought it might help the auto imune disorder. He states the new diet has helped tremendously in terms of energy. However he still has periods of fatigue and low motivation. He saw a doctor in [place] last year and was tried on Zoloft which made him feel "very zoned out." He is also tried Xanax and Valium in the past which made him very fatigued. He's very sensitive to medicines and has to use low dosages. He sleeps well and eats "at times" of gluten-free foods that still doesn't gain weight. He is totally stop drinking smoking and even has dropped caffeine. 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  HENT: Negative.   Respiratory: Negative.   Cardiovascular: Negative.   Gastrointestinal: Positive for abdominal pain.  Endocrine: Negative.   Genitourinary: Negative.   Musculoskeletal: Positive for neck pain.  Skin: Negative.   Allergic/Immunologic: Negative.   Neurological: Positive for dizziness.  Hematological: Negative.   Psychiatric/Behavioral: Positive for dysphoric mood. The patient is nervous/anxious.    Physical Exam not done  Depressive Symptoms: depressed mood, anhedonia, fatigue, weight loss,  (Hypo) Manic Symptoms:   Elevated Mood:  No Irritable Mood:  No Grandiosity:  No Distractibility:  No Labiality of Mood:  Yes Delusions:  No Hallucinations:  No Impulsivity:  No Sexually Inappropriate Behavior:  No Financial Extravagance:  No Flight of Ideas:  No  Anxiety Symptoms: Excessive Worry:  Yes Panic Symptoms:  Yes Agoraphobia:  No Obsessive Compulsive: No  Symptoms: None, Specific Phobias:  No Social Anxiety:  No  Psychotic Symptoms:  Hallucinations: No None Delusions:  No Paranoia:  No   Ideas of Reference:  No  PTSD Symptoms: Ever had a traumatic exposure:  No Had a traumatic exposure in the last month:  No Re-experiencing: No None Hypervigilance:  No Hyperarousal: No None Avoidance: No None  Traumatic Brain Injury: No  Past Psychiatric History: Diagnosis: Generalized anxiety disorder   Hospitalizations: none  Outpatient Care: He saw a psychiatrist in [place] last year   Substance  Abuse Care: none  Self-Mutilation: none  Suicidal Attempts: none  Violent Behaviors: none   Past Medical History:   Past Medical History  Diagnosis Date  . Hypothyroidism   . Hypogonadism, male    History of Loss of Consciousness:  No Seizure History:  No Cardiac History:  No Allergies:    Allergies  Allergen Reactions  . Solu-Medrol [Methylprednisolone Acetate] Shortness Of Breath, Nausea And Vomiting and Other (See Comments)    Hallucinations,    Current Medications:  Current Outpatient Prescriptions  Medication Sig Dispense Refill  . escitalopram (LEXAPRO) 10 MG tablet Take 1 tablet (10 mg total) by mouth daily.  30 tablet  2  . levothyroxine (SYNTHROID, LEVOTHROID) 25 MCG tablet Take 25 mcg by mouth daily before breakfast.       . LORazepam (ATIVAN) 0.5 MG tablet Take one daily as needed for anxiety  30 tablet  2  . omeprazole-sodium bicarbonate (ZEGERID) 40-1100 MG per capsule Take 1 capsule by mouth daily before breakfast.       No current facility-administered medications for this visit.    Previous Psychotropic Medications:  Medication Dose   Zoloft, Xanax, Valium                        Substance Abuse History in the last 12 months: Substance Age of 1st Use Last Use Amount Specific Type  Nicotine      Alcohol      Cannabis      Opiates      Cocaine      Methamphetamines      LSD      Ecstasy      Benzodiazepines      Caffeine      Inhalants      Others:                          Medical Consequences of Substance Abuse: none  Legal Consequences of Substance Abuse: none  Family Consequences of Substance Abuse: none  Blackouts:  No DT's:  No Withdrawal Symptoms:  No None  Social History: Current Place of Residence: [address] of Birth: [place] Family Members: Parents, sister, wife, daughter Marital Status:  Married Children:   Sons:   Daughters: 1 Relationships:  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 Legal History:none Hobbies/Interests: Building cars  Family History:   Family History  Problem Relation Age of Onset  .  Colon cancer Neg Hx   . Inflammatory bowel disease Neg Hx   . Celiac disease Neg Hx   . Anxiety disorder Sister   . Anxiety disorder Maternal Uncle   . Depression Maternal Grandfather     Mental Status Examination/Evaluation: Objective:  Appearance: Casual, Neat and Well Groomed tall very thin white male   Eye Contact::  Good  Speech:  Pressured  Volume:  Normal  Mood:  Anxious, mildly hyperactive   Affect:  Congruent  Thought Process:  Goal Directed  Orientation:  Full (Time, Place, and Person)  Thought Content:  Rumination  Suicidal Thoughts:  No  Homicidal Thoughts:  No  Judgement:  Good  Insight:  Good  Psychomotor Activity:  Increased  Akathisia:  No  Handed:  Right  AIMS (if indicated):    Assets:  Communication Skills Desire for Improvement Financial Resources/Insurance Social Support Talents/Skills  Laboratory/X-Ray Psychological Evaluation(s)   Reviewed in the records      Assessment:  Axis I: Generalized Anxiety Disorder  AXIS I Generalized Anxiety Disorder  AXIS II Deferred  AXIS III Past Medical History  Diagnosis Date  . Hypothyroidism   . Hypogonadism, male      AXIS IV other psychosocial or environmental problems  AXIS V 61-70 mild symptoms   Treatment Plan/Recommendations:  Plan of Care: Medication management   Laboratory:    Psychotherapy: He will be scheduled to see Sudie BaileyJohn Rodenbaugh here   Medications: Given his previous sensitivity to medicines he'll start at low dosages-Lexapro 10 mg per day and lorazepam 0.5 mg per day as needed for anxiety   Routine PRN Medications:  Yes  Consultations:   Safety Concerns:  He denies thoughts of self-harm   Other:  He'll return in four-week's     Diannia RuderOSS, Faatima Tench, MD 6/8/20159:43 AM

## 2014-01-14 ENCOUNTER — Telehealth: Payer: Self-pay | Admitting: Internal Medicine

## 2014-01-14 NOTE — Telephone Encounter (Signed)
Lab order has already been mailed to the pt.

## 2014-01-14 NOTE — Telephone Encounter (Signed)
CBC IN 2 MONTHS

## 2014-01-15 ENCOUNTER — Ambulatory Visit (HOSPITAL_COMMUNITY): Payer: Self-pay | Admitting: Psychology

## 2014-01-21 ENCOUNTER — Ambulatory Visit (HOSPITAL_COMMUNITY): Payer: Self-pay | Admitting: Psychology

## 2014-01-26 ENCOUNTER — Ambulatory Visit (HOSPITAL_COMMUNITY): Payer: Self-pay | Admitting: Psychiatry

## 2014-02-23 ENCOUNTER — Ambulatory Visit (HOSPITAL_COMMUNITY): Payer: Self-pay | Admitting: Psychology

## 2014-07-20 ENCOUNTER — Other Ambulatory Visit (HOSPITAL_COMMUNITY): Payer: Self-pay | Admitting: "Endocrinology

## 2014-07-20 DIAGNOSIS — G8929 Other chronic pain: Secondary | ICD-10-CM

## 2014-07-20 DIAGNOSIS — R51 Headache: Principal | ICD-10-CM

## 2014-07-20 DIAGNOSIS — J329 Chronic sinusitis, unspecified: Secondary | ICD-10-CM

## 2014-07-22 ENCOUNTER — Ambulatory Visit (HOSPITAL_COMMUNITY)
Admission: RE | Admit: 2014-07-22 | Discharge: 2014-07-22 | Disposition: A | Discharge status: Home / Self Care | Payer: BC Managed Care – PPO | Source: Ambulatory Visit | Attending: "Endocrinology | Admitting: "Endocrinology

## 2014-07-22 DIAGNOSIS — J342 Deviated nasal septum: Secondary | ICD-10-CM | POA: Insufficient documentation

## 2014-07-22 DIAGNOSIS — R51 Headache: Secondary | ICD-10-CM | POA: Insufficient documentation

## 2014-07-22 DIAGNOSIS — R519 Headache, unspecified: Secondary | ICD-10-CM

## 2014-07-22 DIAGNOSIS — J3489 Other specified disorders of nose and nasal sinuses: Secondary | ICD-10-CM | POA: Insufficient documentation

## 2014-07-22 DIAGNOSIS — J329 Chronic sinusitis, unspecified: Secondary | ICD-10-CM

## 2014-11-02 ENCOUNTER — Encounter (HOSPITAL_COMMUNITY): Payer: Self-pay | Admitting: Psychiatry

## 2014-11-02 ENCOUNTER — Ambulatory Visit (INDEPENDENT_AMBULATORY_CARE_PROVIDER_SITE_OTHER): Payer: BLUE CROSS/BLUE SHIELD | Admitting: Psychiatry

## 2014-11-02 VITALS — Ht 75.0 in

## 2014-11-02 DIAGNOSIS — F411 Generalized anxiety disorder: Secondary | ICD-10-CM

## 2014-11-02 MED ORDER — LORAZEPAM 0.5 MG PO TABS: ORAL_TABLET | ORAL | Status: AC

## 2014-11-02 NOTE — Progress Notes (Signed)
Patient ID: Johnny Wang, male   DOB: 1981/03/25, 34 y.o.   MRN: [id number]  Psychiatric Assessment Adult  Patient Identification:  Johnny Wang Date of Evaluation:  11/02/2014 Chief Complaint: "I get nervous a lot History of Chief Complaint:   Chief Complaint  Patient presents with  . Anxiety  . Follow-up    Anxiety Symptoms include dizziness and nervous/anxious behavior.     this patient is a 34 year old married white male lives with his wife and 35-year-old daughter in [place]. He is a Production designer, theatre/television/film at [employer].  The patient was referred by Dr. Fransico Him, his endocrinologist, for assessment and treatment of anxiety and depression.  The patient stated his difficulty started about 4 years ago. 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 He drank 3-4 beers a night, smoke cigarettes and didn't eat properly. He began to get more and more fatigued. Eventually he was diagnosed with hypothyroidism and more recently with Hashimoto's thyroiditis. He used to weigh 227 pounds and now is down to 168 pounds. He's also had a lot of stomach problems. Along with this came panic attacks anxiety and a feeling of dissatisfaction and mild depression. He denies suicidal ideation or psychotic symptoms  The patient is now on Synthroid but he still doesn't feel right. He's not able to gain weight. He went on a gluten-free diet because it thought it might help the auto imune disorder. He states the new diet has helped tremendously in terms of energy. However he still has periods of fatigue and low motivation. He saw a doctor in [place] last year and was tried on Zoloft which made him feel "very zoned out." He is also tried Xanax and Valium in the past which made him very fatigued. He's very sensitive to medicines and has to use low dosages. He sleeps well and eats "at times" of gluten-free foods that still doesn't gain weight. He is totally stop drinking smoking and  even has dropped caffeine. 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 He was seen in May of last year. At the time he was very anxious and Ativan and Lexapro are prescribed. He didn't feel comfortable taking the Lexapro and is only use the Ativan sporadically. He is still on Synthroid and is trying to get his thyroid situation regulated. He would like to continue the Ativan for now because he still has anxiety. He seems very wary of antidepressant medicine and I explained to him that these are safer than drugs like Ativan Review of Systems  Constitutional: Positive for fatigue and unexpected weight change.  HENT: Negative.   Respiratory: Negative.   Cardiovascular: Negative.   Gastrointestinal: Positive for abdominal pain.  Endocrine: Negative.   Genitourinary: Negative.   Musculoskeletal: Positive for neck pain.  Skin: Negative.   Allergic/Immunologic: Negative.   Neurological: Positive for dizziness.  Hematological: Negative.   Psychiatric/Behavioral: Positive for dysphoric mood. The patient is nervous/anxious.    Physical Exam not done  Depressive Symptoms: depressed mood, anhedonia, fatigue, weight loss,  (Hypo) Manic Symptoms:   Elevated Mood:  No Irritable Mood:  No Grandiosity:  No Distractibility:  No Labiality of Mood:  Yes Delusions:  No Hallucinations:  No Impulsivity:  No Sexually Inappropriate Behavior:  No Financial Extravagance:  No Flight of Ideas:  No  Anxiety Symptoms: Excessive Worry:  Yes  Panic Symptoms:  Yes Agoraphobia:  No Obsessive Compulsive: No  Symptoms: None, Specific Phobias:  No Social Anxiety:  No  Psychotic Symptoms:  Hallucinations: No None Delusions:  No Paranoia:  No   Ideas of Reference:  No  PTSD Symptoms: Ever had a traumatic exposure:  No Had a traumatic exposure in the last month:  No Re-experiencing: No  None Hypervigilance:  No Hyperarousal: No None Avoidance: No None  Traumatic Brain Injury: No  Past Psychiatric History: Diagnosis: Generalized anxiety disorder   Hospitalizations: none  Outpatient Care: He saw a psychiatrist in [place] last year   Substance Abuse Care: none  Self-Mutilation: none  Suicidal Attempts: none  Violent Behaviors: none   Past Medical History:   Past Medical History  Diagnosis Date  . Hypothyroidism   . Hypogonadism, male    History of Loss of Consciousness:  No Seizure History:  No Cardiac History:  No Allergies:   Allergies  Allergen Reactions  . Solu-Medrol [Methylprednisolone Acetate] Shortness Of Breath, Nausea And Vomiting and Other (See Comments)    Hallucinations,    Current Medications:  Current Outpatient Prescriptions  Medication Sig Dispense Refill  . levothyroxine (SYNTHROID, LEVOTHROID) 25 MCG tablet Take 25 mcg by mouth daily before breakfast.     . LORazepam (ATIVAN) 0.5 MG tablet Take one daily as needed for anxiety 30 tablet 2   No current facility-administered medications for this visit.    Previous Psychotropic Medications:  Medication Dose   Zoloft, Xanax, Valium                        Substance Abuse History in the last 12 months: Substance Age of 1st Use Last Use Amount Specific Type  Nicotine      Alcohol      Cannabis      Opiates      Cocaine      Methamphetamines      LSD      Ecstasy      Benzodiazepines      Caffeine      Inhalants      Others:                          Medical Consequences of Substance Abuse: none  Legal Consequences of Substance Abuse: none  Family Consequences of Substance Abuse: none  Blackouts:  No DT's:  No Withdrawal Symptoms:  No None  Social History: Current Place of Residence: [address] of Birth: [place] Family Members: Parents, sister, wife, daughter Marital Status:  Married Children:   Sons:   Daughters:  1 Relationships:  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 Legal History:none Hobbies/Interests: Building cars  Family History:   Family History  Problem Relation Age of Onset  . Colon cancer Neg Hx   . Inflammatory bowel disease Neg Hx   . Celiac disease Neg Hx   . Anxiety disorder Sister   . Anxiety disorder Maternal Uncle   . Depression Maternal Grandfather     Mental Status Examination/Evaluation: Objective:  Appearance: Casual, Neat and Well Groomed tall very thin white male   Eye Contact::  Good  Speech:  Pressured  Volume:  Normal  Mood:  Anxious, mildly hyperactive   Affect:  Congruent  Thought Process:  Goal Directed  Orientation:  Full (Time, Place,  and Person)  Thought Content:  Rumination  Suicidal Thoughts:  No  Homicidal Thoughts:  No  Judgement:  Good  Insight:  Good  Psychomotor Activity:  Increased  Akathisia:  No  Handed:  Right  AIMS (if indicated):    Assets:  Communication Skills Desire for Improvement Financial Resources/Insurance Social Support Talents/Skills    Laboratory/X-Ray Psychological Evaluation(s)   Reviewed in the records      Assessment:  Axis I: Generalized Anxiety Disorder  AXIS I Generalized Anxiety Disorder  AXIS II Deferred  AXIS III Past Medical History  Diagnosis Date  . Hypothyroidism   . Hypogonadism, male      AXIS IV other psychosocial or environmental problems  AXIS V 61-70 mild symptoms   Treatment Plan/Recommendations:  Plan of Care: Medication management   Laboratory:    Psychotherapy: He declines therapy   Medications: He will continue Ativan 0.5 mg daily as needed   Routine PRN Medications:  Yes  Consultations:   Safety Concerns:  He denies thoughts of self-harm   Other:  He'll return in four- months    Diannia RuderOSS, Albeiro Trompeter,  MD 4/11/20164:24 PM

## 2015-03-04 ENCOUNTER — Ambulatory Visit (HOSPITAL_COMMUNITY): Payer: Self-pay | Admitting: Psychiatry

## 2015-03-04 ENCOUNTER — Encounter (HOSPITAL_COMMUNITY): Payer: Self-pay | Admitting: Psychiatry

## 2019-12-31 ENCOUNTER — Other Ambulatory Visit: Payer: Self-pay

## 2019-12-31 ENCOUNTER — Emergency Department (HOSPITAL_BASED_OUTPATIENT_CLINIC_OR_DEPARTMENT_OTHER)
Admission: EM | Admit: 2019-12-31 | Discharge: 2019-12-31 | Disposition: A | Discharge status: Home / Self Care | Payer: Self-pay | Attending: Emergency Medicine | Admitting: Emergency Medicine

## 2019-12-31 ENCOUNTER — Emergency Department (HOSPITAL_BASED_OUTPATIENT_CLINIC_OR_DEPARTMENT_OTHER): Payer: Self-pay

## 2019-12-31 ENCOUNTER — Encounter (HOSPITAL_BASED_OUTPATIENT_CLINIC_OR_DEPARTMENT_OTHER): Payer: Self-pay

## 2019-12-31 DIAGNOSIS — E039 Hypothyroidism, unspecified: Secondary | ICD-10-CM | POA: Insufficient documentation

## 2019-12-31 DIAGNOSIS — R079 Chest pain, unspecified: Secondary | ICD-10-CM

## 2019-12-31 DIAGNOSIS — Z79899 Other long term (current) drug therapy: Secondary | ICD-10-CM | POA: Insufficient documentation

## 2019-12-31 DIAGNOSIS — Z87891 Personal history of nicotine dependence: Secondary | ICD-10-CM | POA: Insufficient documentation

## 2019-12-31 DIAGNOSIS — R0789 Other chest pain: Secondary | ICD-10-CM | POA: Insufficient documentation

## 2019-12-31 HISTORY — DX: Anxiety disorder, unspecified: F41.9

## 2019-12-31 HISTORY — DX: Diaphragmatic hernia without obstruction or gangrene: K44.9

## 2019-12-31 HISTORY — DX: Autoimmune thyroiditis: E06.3

## 2019-12-31 LAB — BASIC METABOLIC PANEL
Anion gap: 6 (ref 5–15)
BUN: 14 mg/dL (ref 6–20)
CO2: 29 mmol/L (ref 22–32)
Calcium: 8.9 mg/dL (ref 8.9–10.3)
Chloride: 103 mmol/L (ref 98–111)
Creatinine, Ser: 1.11 mg/dL (ref 0.61–1.24)
GFR calc Af Amer: >60 mL/min (ref >60)
GFR calc non Af Amer: >60 mL/min (ref >60)
Glucose, Bld: 98 mg/dL (ref 70–99)
Potassium: 3.6 mmol/L (ref 3.5–5.1)
Sodium: 138 mmol/L (ref 135–145)

## 2019-12-31 LAB — CBC
HCT: 44.9 % (ref 39.0–52.0)
Hemoglobin: 15.4 g/dL (ref 13.0–17.0)
MCH: 31.6 pg (ref 26.0–34.0)
MCHC: 34.3 g/dL (ref 30.0–36.0)
MCV: 92 fL (ref 80.0–100.0)
Platelets: 189 10*3/uL (ref 150–400)
RBC: 4.88 MIL/uL (ref 4.22–5.81)
RDW: 11.4 % — ABNORMAL LOW (ref 11.5–15.5)
WBC: 5 10*3/uL (ref 4.0–10.5)
nRBC: 0 % (ref 0.0–0.2)

## 2019-12-31 LAB — TROPONIN I (HIGH SENSITIVITY): Troponin I (High Sensitivity): 4 ng/L (ref <18)

## 2019-12-31 MED ORDER — SODIUM CHLORIDE 0.9% FLUSH
3.0000 mL | Freq: Once | INTRAVENOUS | Status: DC
  Filled 2019-12-31: qty 3

## 2019-12-31 MED ORDER — METHOCARBAMOL 500 MG PO TABS: 500.0000 mg | ORAL_TABLET | Freq: Two times a day (BID) | ORAL | 0 refills | Status: AC

## 2019-12-31 NOTE — Discharge Instructions (Addendum)
Please follow-up with your primary care doctor.  Your lab works, EKG, chest x-ray, and evaluation were reassuring today.  Please continue to take your thyroid medication.  Please discuss with your primary care doctor and antianxiety medication as well as your concerns for possible hiatal hernia.  I have also provided you with a muscle relaxer which you may use as needed for pain as this may be related to a muscle spasm although, as we discussed, I was not able to find this on exam.  Please return to ED for any new or concerning symptoms.

## 2019-12-31 NOTE — ED Notes (Signed)
ED Provider at bedside discussing test results and dispo plan of care. 

## 2019-12-31 NOTE — ED Provider Notes (Signed)
[facility] EMERGENCY DEPARTMENT Provider Note   CSN: [id number] Arrival date & time: 12/31/19  1418     History Chief Complaint  Patient presents with  . Chest Pain    Johnny Wang is a 39 y.o. male.  HPI Patient is a 39 year old male presented today with chest pain/chest pressure chief complaint.  Patient states that it is left-sided, has been intermittent for approximately 2 weeks but states that it seemed to be worse today.  He states he also has an ache in the left side of his neck.  He also states he has some left-sided shoulder/back pain.  He denies any exertional symptoms but states when he moves his head around especially bring his right ear to his right shoulder seems like it hurts his left side of his neck.  He denies any trauma to the area, denies any vision changes, nausea, vomiting, headache or lightheadedness.  Denies any syncope or near syncope.  Patient states he has a history of smoking but has not smoked in many years.  He states he uses alcohol infrequently but states that he has recently quit completely.  He states that he suffers from anxiety on a daily basis and does use benzodiazepine medication for this.  He states he is also try to cut back on this.  He denies any seizures, diaphoresis, sweats, shakes, tremors.  He states that chest pain is 4/10 nonradiating and with no aggravating or mitigating factors.  He states he has no exertional or pleuritic symptoms.  He states that he is currently remodeling his house and feels very stressed from this as well as stress from his daughter's recent diagnosis of osteosarcoma.  He denies any history of DVT, PE, recent surgery, immobilization, cough, pleuritic chest pain, shortness of breath, hemoptysis      Past Medical History:  Diagnosis Date  . Anxiety   . Hashimoto's disease   . Hiatal hernia   . Hypogonadism, male   . Hypothyroidism     Patient Active Problem List   Diagnosis Date Noted  . Generalized  anxiety disorder 12/29/2013  . Other malaise and fatigue 11/17/2013  . Early satiety 09/10/2013  . Abnormal weight loss 09/10/2013  . GERD (gastroesophageal reflux disease) 09/10/2013  . Change in bowel function 09/10/2013  . Rectal bleeding 09/10/2013  . Unspecified constipation 09/10/2013    Past Surgical History:  Procedure Laterality Date  . COLONOSCOPY WITH ESOPHAGOGASTRODUODENOSCOPY (EGD) N/A 09/24/2013   YQI:[id number] reflux esophagitis (mild)/small HH/Status post duodenal biopsy (benign)/TCS:Friable anal canal; otherwise, normal colon  . none    . WISDOM TOOTH EXTRACTION Bilateral 2013       Family History  Problem Relation Age of Onset  . Anxiety disorder Sister   . Anxiety disorder Maternal Uncle   . Depression Maternal Grandfather   . Colon cancer Neg Hx   . Inflammatory bowel disease Neg Hx   . Celiac disease Neg Hx     Social History   Tobacco Use  . Smoking status: Former Smoker    Packs/day: 1.50    Years: 10.00    Pack years: 15.00    Quit date: 09/06/2012    Years since quitting: 7.3  . Smokeless tobacco: Former Engineer, water Use Topics  . Alcohol use: Yes    Comment: rare  . Drug use: Yes    Types: Marijuana    Home Medications Prior to Admission medications   Medication Sig Start Date End Date Taking? Authorizing Provider  levothyroxine (SYNTHROID,  LEVOTHROID) 25 MCG tablet Take 25 mcg by mouth daily before breakfast.  08/13/13  Yes [provider]  LORazepam (ATIVAN) 0.5 MG tablet Take one daily as needed for anxiety Patient taking differently: 0.25 mg every 4 (four) hours as needed. Take one daily as needed for anxiety 11/02/14  Yes Myrlene Broker, MD  methocarbamol (ROBAXIN) 500 MG tablet Take 1 tablet (500 mg total) by mouth 2 (two) times daily. 12/31/19   Gailen Shelter, PA    Allergies    Solu-medrol [methylprednisolone sodium succ]  Review of Systems   Review of Systems  Constitutional: Negative for chills and fever.  HENT:  Negative for congestion.   Eyes: Negative for pain.  Respiratory: Negative for cough and shortness of breath.   Cardiovascular: Positive for chest pain. Negative for leg swelling.  Gastrointestinal: Negative for abdominal pain and vomiting.  Genitourinary: Negative for dysuria.  Musculoskeletal: Positive for neck pain. Negative for myalgias.  Skin: Negative for rash.  Neurological: Negative for dizziness and headaches.    Physical Exam Updated Vital Signs BP 109/80   Pulse 81   Temp 98.1 F (36.7 C)   Resp (!) 24   Ht 6\' 2"  (1.88 m)   Wt 76.4 kg   SpO2 99%   BMI 21.63 kg/m   Physical Exam Vitals and nursing note reviewed.  Constitutional:      General: He is not in acute distress.    Comments: Pleasant well-appearing 39 year old male appears stated age.  Mother at bedside able corroborate story.  Patient is able answer questions appropriately follow commands.  HENT:     Head: Normocephalic and atraumatic.     Nose: Nose normal.     Mouth/Throat:     Mouth: Mucous membranes are moist.  Eyes:     General: No scleral icterus.    Extraocular Movements: Extraocular movements intact.     Pupils: Pupils are equal, round, and reactive to light.  Cardiovascular:     Rate and Rhythm: Normal rate and regular rhythm.     Pulses: Normal pulses.     Heart sounds: Normal heart sounds.  Pulmonary:     Effort: Pulmonary effort is normal. No respiratory distress.     Breath sounds: No wheezing.  Abdominal:     Palpations: Abdomen is soft.     Tenderness: There is no abdominal tenderness. There is no right CVA tenderness, left CVA tenderness, guarding or rebound.  Musculoskeletal:     Cervical back: Normal range of motion.     Right lower leg: No edema.     Left lower leg: No edema.  Skin:    General: Skin is warm and dry.     Capillary Refill: Capillary refill takes less than 2 seconds.  Neurological:     Mental Status: He is alert. Mental status is at baseline.  Psychiatric:         Mood and Affect: Mood normal.        Behavior: Behavior normal.     ED Results / Procedures / Treatments   Labs (all labs ordered are listed, but only abnormal results are displayed) Labs Reviewed  CBC - Abnormal; Notable for the following components:      Result Value   RDW 11.4 (*)    All other components within normal limits  BASIC METABOLIC PANEL  TROPONIN I (HIGH SENSITIVITY)  TROPONIN I (HIGH SENSITIVITY)    EKG EKG Interpretation  Date/Time:  Wednesday December 31 2019 14:22:14 EDT Ventricular Rate:  86 PR Interval:  140 QRS Duration: 96 QT Interval:  358 QTC Calculation: 428 R Axis:   103 Text Interpretation: Normal sinus rhythm with sinus arrhythmia Rightward axis Borderline ECG Confirmed by Geoffery Lyons ([id number]) on 12/31/2019 2:53:17 PM   Radiology DG Chest 2 View  Result Date: 12/31/2019 CLINICAL DATA:  Left chest pain and tightness radiating into the back and neck for 3 weeks. EXAM: CHEST - 2 VIEW COMPARISON:  None. FINDINGS: Lungs clear. Heart size normal. No pneumothorax or pleural fluid. No acute or focal bony abnormality. IMPRESSION: Negative chest. Electronically Signed   By: Drusilla Kanner M.D.   On: 12/31/2019 15:13    Procedures Procedures (including critical care time)  Medications Ordered in ED Medications  sodium chloride flush (NS) 0.9 % injection 3 mL (3 mLs Intravenous Not Given 12/31/19 1508)    ED Course  I have reviewed the triage vital signs and the nursing notes.  Pertinent labs & imaging results that were available during my care of the patient were reviewed by me and considered in my medical decision making (see chart for details).  Patient is presented today is a 39 year old male with no past medical history other than anxiety for which he takes benzodiazepine medication.  He is presenting for chest pain as well as some neck pain.  Chest pain is very atypical in presentation.  Is been ongoing intermittently for approximately 2 weeks  although seems of worsened somewhat today.  The emergent causes of chest pain include: Acute coronary syndrome, tamponade, pericarditis/myocarditis, aortic dissection, pulmonary embolism, tension pneumothorax, pneumonia, and esophageal rupture.  I do not believe the patient has an emergent cause of chest pain, other urgent/non-acute considerations include, but are not limited to: chronic angina, aortic stenosis, cardiomyopathy, mitral valve prolapse, pulmonary hypertension, aortic insufficiency, right ventricular hypertrophy, pleuritis, bronchitis, pneumothorax, tumor, gastroesophageal reflux disease (GERD), esophageal spasm, Mallory-Weiss syndrome, peptic ulcer disease, pancreatitis, functional gastrointestinal pain, cervical or thoracic disk disease or arthritis, shoulder arthritis, costochondritis, subacromial bursitis, anxiety or panic attack, herpes zoster, breast disorders, chest wall tumors, thoracic outlet syndrome, mediastinitis.    Clinical Course as of Dec 31 1639  Wed Dec 31, 2019  1626 DG chest within normal limits.  No infiltrate or pneumothorax. BMP without electrolyte abnormality. CBC without leukocytosis or anemia. Troponin X1 within normal limits at 4 medication for a second troponin.  I doubt myocarditis.  Very low suspicion for ACS given his this is a healthy 39 year old gentleman.   [WF]  1626 EKG without any acute ischemia.  No abnormality apart from very mild right axis deviation.  Doubt PE, patient is PERC negative.  Has no history of DVTs no hemoptysis, not tachycardic or hypoxic.   [WF]    Clinical Course User Index [WF] Gailen Shelter, [place]    Patient states that his symptoms are very mild at this time.  I informed patient of his reassuring work-up.  No indication for delta troponin given the longevity of his symptoms as well as low suspicion for ACS.  Myocarditis effectively ruled out at this time.  Patient given strict return precautions he will follow-up with his  PCP.  He is concerned about a hiatal hernia.  I recommended seeing a PCP for this as well as he has not tried any conservative therapy at this point.  MDM Rules/Calculators/A&P                      Provided patient with return precautions and Robaxin for  discomfort given as this may be musculoskeletal in origin given his home renovation effort.   Final Clinical Impression(s) / ED Diagnoses Final diagnoses:  Chest pain, unspecified type    Rx / DC Orders ED Discharge Orders         Ordered    methocarbamol (ROBAXIN) 500 MG tablet  2 times daily     12/31/19 1616           Pati Gallo [place], [place] 12/31/19 1641    Veryl Speak, MD 01/01/20 2307

## 2019-12-31 NOTE — ED Triage Notes (Signed)
Pt c/o left side CP x 2 weeks-states pain is now in left side of neck and back-states when he turns his head he feels increase in pain-denies injury prior to pain onset-NAD-steady gait

## 2020-12-28 ENCOUNTER — Other Ambulatory Visit: Payer: Self-pay

## 2020-12-28 ENCOUNTER — Encounter (HOSPITAL_BASED_OUTPATIENT_CLINIC_OR_DEPARTMENT_OTHER): Payer: Self-pay | Admitting: *Deleted

## 2020-12-28 ENCOUNTER — Emergency Department (HOSPITAL_BASED_OUTPATIENT_CLINIC_OR_DEPARTMENT_OTHER)
Admission: EM | Admit: 2020-12-28 | Discharge: 2020-12-29 | Disposition: A | Discharge status: Home / Self Care | Payer: Self-pay | Attending: Emergency Medicine | Admitting: Emergency Medicine

## 2020-12-28 ENCOUNTER — Emergency Department (HOSPITAL_BASED_OUTPATIENT_CLINIC_OR_DEPARTMENT_OTHER): Payer: Self-pay

## 2020-12-28 DIAGNOSIS — Z79899 Other long term (current) drug therapy: Secondary | ICD-10-CM | POA: Insufficient documentation

## 2020-12-28 DIAGNOSIS — E039 Hypothyroidism, unspecified: Secondary | ICD-10-CM | POA: Insufficient documentation

## 2020-12-28 DIAGNOSIS — Z87891 Personal history of nicotine dependence: Secondary | ICD-10-CM | POA: Insufficient documentation

## 2020-12-28 DIAGNOSIS — L02414 Cutaneous abscess of left upper limb: Secondary | ICD-10-CM | POA: Insufficient documentation

## 2020-12-28 DIAGNOSIS — L0291 Cutaneous abscess, unspecified: Secondary | ICD-10-CM

## 2020-12-28 LAB — CBC WITH DIFFERENTIAL/PLATELET
Abs Immature Granulocytes: 0.02 10*3/uL (ref 0.00–0.07)
Basophils Absolute: 0 10*3/uL (ref 0.0–0.1)
Basophils Relative: 0 %
Eosinophils Absolute: 0.1 10*3/uL (ref 0.0–0.5)
Eosinophils Relative: 1 %
HCT: 47 % (ref 39.0–52.0)
Hemoglobin: 16.2 g/dL (ref 13.0–17.0)
Immature Granulocytes: 0 %
Lymphocytes Relative: 12 %
Lymphs Abs: 1.2 10*3/uL (ref 0.7–4.0)
MCH: 32 pg (ref 26.0–34.0)
MCHC: 34.5 g/dL (ref 30.0–36.0)
MCV: 92.7 fL (ref 80.0–100.0)
Monocytes Absolute: 1 10*3/uL (ref 0.1–1.0)
Monocytes Relative: 10 %
Neutro Abs: 7.4 10*3/uL (ref 1.7–7.7)
Neutrophils Relative %: 77 %
Platelets: 202 10*3/uL (ref 150–400)
RBC: 5.07 MIL/uL (ref 4.22–5.81)
RDW: 11.7 % (ref 11.5–15.5)
WBC: 9.7 10*3/uL (ref 4.0–10.5)
nRBC: 0 % (ref 0.0–0.2)

## 2020-12-28 LAB — COMPREHENSIVE METABOLIC PANEL
ALT: 19 U/L (ref 0–44)
AST: 20 U/L (ref 15–41)
Albumin: 4.8 g/dL (ref 3.5–5.0)
Alkaline Phosphatase: 74 U/L (ref 38–126)
Anion gap: 8 (ref 5–15)
BUN: 11 mg/dL (ref 6–20)
CO2: 28 mmol/L (ref 22–32)
Calcium: 9.3 mg/dL (ref 8.9–10.3)
Chloride: 101 mmol/L (ref 98–111)
Creatinine, Ser: 1.1 mg/dL (ref 0.61–1.24)
GFR, Estimated: >60 mL/min (ref >60)
Glucose, Bld: 106 mg/dL — ABNORMAL HIGH (ref 70–99)
Potassium: 3.8 mmol/L (ref 3.5–5.1)
Sodium: 137 mmol/L (ref 135–145)
Total Bilirubin: 0.5 mg/dL (ref 0.3–1.2)
Total Protein: 8.1 g/dL (ref 6.5–8.1)

## 2020-12-28 MED ORDER — SODIUM CHLORIDE 0.9 % IV BOLUS: 1000.0000 mL | Freq: Once | INTRAVENOUS | Status: DC

## 2020-12-28 MED ORDER — ACETAMINOPHEN 500 MG PO TABS
1000.0000 mg | ORAL_TABLET | Freq: Once | ORAL | Status: DC
  Filled 2020-12-28: qty 2

## 2020-12-28 NOTE — ED Notes (Signed)
PA to triage tp eval pt

## 2020-12-28 NOTE — ED Provider Notes (Signed)
[facility] EMERGENCY DEPARTMENT Provider Note   CSN: [id number] Arrival date & time: 12/28/20  1955     History Chief Complaint  Patient presents with  . Abscess    Johnny Wang is a 40 y.o. male.  HPI Patient is a 40 year old male presented today with left forearm abscess and swelling.  He states that he shaved his arm for a tattoo approximately 1 month ago and states that since that time he has had some irritated skin in this area with a boil.  He states that he attempted to lance it on his own with a knife.  He states that then he squeezed it and seems to get worse.  He was seen at urgent care on Sunday and started on Keflex he states that he took this 3 times Saturday and continue to take the antibiotic until he was seen in urgent care again today at approximately 9 AM this morning and was switched to clindamycin.  He states that incision and drainage was done at urgent care however he states that there was no lidocaine available and it was extremely painful and provider had some difficulty with incision and drainage.  He states that he was switched to clindamycin and has taken 2 doses today already.      Past Medical History:  Diagnosis Date  . Anxiety   . Hashimoto's disease   . Hiatal hernia   . Hypogonadism, male   . Hypothyroidism     Patient Active Problem List   Diagnosis Date Noted  . Generalized anxiety disorder 12/29/2013  . Other malaise and fatigue 11/17/2013  . Early satiety 09/10/2013  . Abnormal weight loss 09/10/2013  . GERD (gastroesophageal reflux disease) 09/10/2013  . Change in bowel function 09/10/2013  . Rectal bleeding 09/10/2013  . Unspecified constipation 09/10/2013    Past Surgical History:  Procedure Laterality Date  . COLONOSCOPY WITH ESOPHAGOGASTRODUODENOSCOPY (EGD) N/A 09/24/2013   ZYY:QMGNOIB reflux esophagitis (mild)/small HH/Status post duodenal biopsy (benign)/TCS:Friable anal canal; otherwise, normal colon  . none    .  WISDOM TOOTH EXTRACTION Bilateral 2013       Family History  Problem Relation Age of Onset  . Anxiety disorder Sister   . Anxiety disorder Maternal Uncle   . Depression Maternal Grandfather   . Colon cancer Neg Hx   . Inflammatory bowel disease Neg Hx   . Celiac disease Neg Hx     Social History   Tobacco Use  . Smoking status: Former Smoker    Packs/day: 1.50    Years: 10.00    Pack years: 15.00    Quit date: 09/06/2012    Years since quitting: 8.3  . Smokeless tobacco: Former Clinical biochemist  . Vaping Use: Never used  Substance Use Topics  . Alcohol use: Yes    Comment: rare  . Drug use: Yes    Types: Marijuana    Home Medications Prior to Admission medications   Medication Sig Start Date End Date Taking? Authorizing Provider  levothyroxine (SYNTHROID, LEVOTHROID) 25 MCG tablet Take 25 mcg by mouth daily before breakfast.  08/13/13   [provider]  LORazepam (ATIVAN) 0.5 MG tablet Take one daily as needed for anxiety Patient taking differently: 0.25 mg every 4 (four) hours as needed. Take one daily as needed for anxiety 11/02/14   Myrlene Broker, MD  methocarbamol (ROBAXIN) 500 MG tablet Take 1 tablet (500 mg total) by mouth 2 (two) times daily. 12/31/19   Solon Augusta  S, PA    Allergies    Solu-medrol [methylprednisolone sodium succ]  Review of Systems   Review of Systems  Constitutional: Negative for chills, fatigue and fever.  HENT: Negative for congestion.   Respiratory: Negative for shortness of breath.   Cardiovascular: Negative for chest pain.  Gastrointestinal: Negative for abdominal pain.  Musculoskeletal: Negative for neck pain.  Skin:       Boil    Physical Exam Updated Vital Signs BP 118/76   Pulse 83   Temp 98.7 F (37.1 C) (Oral)   Resp 16   Ht 6\' 2"  (1.88 m)   Wt 93.9 kg   SpO2 96%   BMI 26.58 kg/m   Physical Exam Vitals and nursing note reviewed.  Constitutional:      General: He is not in acute distress. HENT:      Head: Normocephalic and atraumatic.     Nose: Nose normal.     Mouth/Throat:     Mouth: Mucous membranes are moist.  Eyes:     General: No scleral icterus. Cardiovascular:     Rate and Rhythm: Normal rate and regular rhythm.     Pulses: Normal pulses.     Heart sounds: Normal heart sounds.  Pulmonary:     Effort: Pulmonary effort is normal. No respiratory distress.     Breath sounds: No wheezing.  Abdominal:     Palpations: Abdomen is soft.     Tenderness: There is no abdominal tenderness.  Musculoskeletal:     Cervical back: Normal range of motion.     Right lower leg: No edema.     Left lower leg: No edema.     Comments: Forage motion of left elbow.  Flexion extension of wrist intact.  Skin:    General: Skin is warm and dry.     Capillary Refill: Capillary refill takes less than 2 seconds.     Comments: There is approximately 1 x 2 cm abscess with some purulence noted to the left dorsal forearm.  It is at the mid forearm.  There is some mild fluctuance present.  There is redness induration surrounding this area approximately 6 cm in all directions.  Radial pulses 3+ and symmetric with left arm and right  Neurological:     Mental Status: He is alert. Mental status is at baseline.  Psychiatric:        Mood and Affect: Mood normal.        Behavior: Behavior normal.   Pictures are in media file.  ED Results / Procedures / Treatments   Labs (all labs ordered are listed, but only abnormal results are displayed) Labs Reviewed  COMPREHENSIVE METABOLIC PANEL - Abnormal; Notable for the following components:      Result Value   Glucose, Bld 106 (*)    All other components within normal limits  CULTURE, BLOOD (ROUTINE X 2)  CBC WITH DIFFERENTIAL/PLATELET    EKG None  Radiology DG Elbow Complete Left  Result Date: 12/28/2020 CLINICAL DATA:  Left elbow/forearm abscess EXAM: LEFT ELBOW - COMPLETE 3+ VIEW COMPARISON:  None. FINDINGS: There is diffuse soft tissue swelling about  the elbow. No joint effusion identified. No acute fracture or dislocation. IMPRESSION: Soft tissue swelling.  No acute osseous abnormality Electronically Signed   By: Signa Kellaylor  Stroud M.D.   On: 12/28/2020 21:16    Procedures .Marland Kitchen.Incision and Drainage  Date/Time: 12/29/2020 1:19 AM Performed by: Gailen ShelterFondaw, Shatarra Wehling S, PA Authorized by: Gailen ShelterFondaw, Cheralyn Oliver S, PA   Consent:  Consent obtained:  Verbal   Consent given by:  Patient   Risks discussed:  Bleeding, incomplete drainage, pain and damage to other organs   Alternatives discussed:  No treatment Universal protocol:    Procedure explained and questions answered to patient or proxy's satisfaction: yes     Relevant documents present and verified: yes     Test results available : yes     Imaging studies available: yes     Required blood products, implants, devices, and special equipment available: yes     Site/side marked: yes     Immediately prior to procedure, a time out was called: yes     Patient identity confirmed:  Verbally with patient Location:    Type:  Abscess   Size:  1x2 cm   Location:  Upper extremity   Upper extremity location:  Arm   Arm location:  L lower arm Pre-procedure details:    Skin preparation:  Chlorhexidine Anesthesia:    Anesthesia method:  Local infiltration   Local anesthetic:  Lidocaine 2% WITH epi Procedure type:    Complexity:  Simple Procedure details:    Incision types:  Single straight   Incision depth:  Subcutaneous   Wound management:  Probed and deloculated, irrigated with saline and extensive cleaning   Drainage:  Purulent and bloody   Drainage amount:  Scant Post-procedure details:    Procedure completion:  Tolerated well, no immediate complications     Medications Ordered in ED Medications  lidocaine-EPINEPHrine (XYLOCAINE W/EPI) 2 %-1:200000 (PF) injection 20 mL (20 mLs Infiltration Given by Other 12/29/20 0114)    ED Course  I have reviewed the triage vital signs and the nursing  notes.  Pertinent labs & imaging results that were available during my care of the patient were reviewed by me and considered in my medical decision making (see chart for details).    MDM Rules/Calculators/A&P                          Patient is a 40 year old male presented today with left arm abscess.  Assess patient in triage after RN had obtained lab work including blood cultures.  We will add on left elbow x-ray to evaluate for gas containing bubbles however ultimately suspect patient will need additional incision and drainage here in the ER.  He said he had wanted I&D done earlier today with no lidocaine. There is still some purulence expressed during my examination suspect incomplete drainage.  Patient is distally neurovascularly intact.  I completed MSE exam several hours ago when patient first arrived in ER and arm actually appears much improved from previous evaluation.  In the interim patient took 1 dose of without milligrams of his home Tylenol that he brought with him.  He has elevate his arm.  He has had 2 doses of clindamycin 300 mg today.  He will take 1 additional.  Incision and drainage was successful.  Mild purulence was expressed.  Deep probing of the loculation was conducted.  Irrigation with 500 mL of normal saline was conducted.  Patient tolerated procedure well.  X-ray shows no acute osseous abnormality and soft tissue swelling without air bubbles.  CBC without leukocytosis or anemia.  Blood cultures pending CMP unremarkable  Will discharge home at this time with continued clindamycin use which is already been prescribed to him.  He will follow-up in 2 to 3 days for wound recheck.  Final Clinical Impression(s) / ED Diagnoses Final diagnoses:  Abscess  Abscess of left forearm    Rx / DC Orders ED Discharge Orders    None       Gailen Shelter, [place] 12/29/20 0120    Jacalyn Lefevre, MD 12/29/20 [phone]

## 2020-12-28 NOTE — ED Provider Notes (Signed)
Emergency Medicine Provider Triage Evaluation Note  Johnny Wang , a 40 y.o. male  was evaluated in triage.  Pt complains of left forearm pain swelling redness.  Patient with left forearm pain and swelling redness that started on Friday and worsened until he was seen at urgent care on Sunday and was started on Keflex for cellulitis.  He took 3 doses this is on Sunday and is continue medication until today when he went to urgent care again had incision and drainage done and took 600 mg of clindamycin--2 doses of 300--and states that since his incision that was done earlier today it seems like he has had more redness and swelling of his arm. He denies any fevers.  Denies any lightheadedness or dizziness.  No history of IV drug use.  States that he believes the area became infected after he was shaving it.  States that he feels the pain is actually somewhat improved today.  Review of Systems  Positive: Redness and swelling Negative: Fevers or chills  Physical Exam  BP (!) 126/95   Pulse (!) 105   Temp 98.7 F (37.1 C) (Oral)   Resp 16   Ht 6\' 2"  (1.88 m)   Wt 93.9 kg   SpO2 96%   BMI 26.58 kg/m  Gen:   Awake, no distress  Resp:  Normal effort  MSK:   Moves extremities without difficulty  Other:  Redness warmth and swelling of the left forearm.  There is a recently incised wound to his proximal left forearm.  There is some purulence present.  Medical Decision Making  Medically screening exam initiated at 8:50 PM.  Appropriate orders placed.  DEQUANTE TREMAINE was informed that the remainder of the evaluation will be completed by another provider, this initial triage assessment does not replace that evaluation, and the importance of remaining in the ED until their evaluation is complete.  Patient here with wound.  Started on clindamycin today.  Incision and drainage was done.  Triage RN has obtained blood cultures and CBC, BMP.  Will place order for arm x-ray to evaluate for gas bubbles.    Chong Sicilian Meckling, DOLE 12/28/20 2053    2054, MD 12/29/20 [phone]

## 2020-12-28 NOTE — ED Triage Notes (Signed)
C/o abscess to left arm x 1 week , abx since since sat, I and D today additional  ABx added , increased swelling this pm

## 2020-12-29 MED ORDER — LIDOCAINE-EPINEPHRINE (PF) 2 %-1:200000 IJ SOLN
20.0000 mL | Freq: Once | INTRAMUSCULAR | Status: AC
  Administered 2020-12-29: 20 mL
  Filled 2020-12-29: qty 20

## 2020-12-29 NOTE — Discharge Instructions (Signed)
Please do warm compresses to your arm this will help bring blood flow to the area.  Please use Tylenol and ibuprofen for pain.  Ibuprofen will be helpful and also taken down inflammation.  I have written down the dosages below.  Drink plenty of water.  I have written you a work note to return the day after tomorrow please use this time to recover from your symptoms.  Please take your third dose of clindamycin tonight and then take each dose with a meal tomorrow.  You should start to see some mild improvement on day 3 of antibiotics.  If symptoms worsen as we discussed it is prudent to return to ER.  We have placed an Ace wrap on your arm today which will help with swelling.  Make sure this is not too tight I do not want you to experience any numbness or tingling in her hand.  Elevate your arm when possible.  Keep the bandage placed today on for 12 hours.  You may take it off around noon/1 PM tomorrow.  Do warm compresses but keep the area clean and dry

## 2021-01-02 LAB — CULTURE, BLOOD (ROUTINE X 2)
Culture: NO GROWTH
Special Requests: ADEQUATE

## 2021-12-09 ENCOUNTER — Encounter (HOSPITAL_BASED_OUTPATIENT_CLINIC_OR_DEPARTMENT_OTHER): Payer: Self-pay

## 2021-12-09 ENCOUNTER — Emergency Department (HOSPITAL_BASED_OUTPATIENT_CLINIC_OR_DEPARTMENT_OTHER)
Admission: EM | Admit: 2021-12-09 | Discharge: 2021-12-09 | Disposition: A | Discharge status: Home / Self Care | Payer: PRIVATE HEALTH INSURANCE | Attending: Emergency Medicine | Admitting: Emergency Medicine

## 2021-12-09 ENCOUNTER — Other Ambulatory Visit: Payer: Self-pay

## 2021-12-09 ENCOUNTER — Emergency Department (HOSPITAL_BASED_OUTPATIENT_CLINIC_OR_DEPARTMENT_OTHER): Payer: PRIVATE HEALTH INSURANCE

## 2021-12-09 DIAGNOSIS — E039 Hypothyroidism, unspecified: Secondary | ICD-10-CM | POA: Insufficient documentation

## 2021-12-09 DIAGNOSIS — R109 Unspecified abdominal pain: Secondary | ICD-10-CM | POA: Diagnosis present

## 2021-12-09 DIAGNOSIS — Z79899 Other long term (current) drug therapy: Secondary | ICD-10-CM | POA: Insufficient documentation

## 2021-12-09 DIAGNOSIS — R1032 Left lower quadrant pain: Secondary | ICD-10-CM | POA: Diagnosis not present

## 2021-12-09 LAB — COMPREHENSIVE METABOLIC PANEL
ALT: 15 U/L (ref 0–44)
AST: 15 U/L (ref 15–41)
Albumin: 4.6 g/dL (ref 3.5–5.0)
Alkaline Phosphatase: 61 U/L (ref 38–126)
Anion gap: 8 (ref 5–15)
BUN: 20 mg/dL (ref 6–20)
CO2: 28 mmol/L (ref 22–32)
Calcium: 9.5 mg/dL (ref 8.9–10.3)
Chloride: 101 mmol/L (ref 98–111)
Creatinine, Ser: 1.14 mg/dL (ref 0.61–1.24)
GFR, Estimated: >60 mL/min (ref >60)
Glucose, Bld: 103 mg/dL — ABNORMAL HIGH (ref 70–99)
Potassium: 3.8 mmol/L (ref 3.5–5.1)
Sodium: 137 mmol/L (ref 135–145)
Total Bilirubin: 0.5 mg/dL (ref 0.3–1.2)
Total Protein: 7.1 g/dL (ref 6.5–8.1)

## 2021-12-09 LAB — CBC
HCT: 44.5 % (ref 39.0–52.0)
Hemoglobin: 14.9 g/dL (ref 13.0–17.0)
MCH: 30.3 pg (ref 26.0–34.0)
MCHC: 33.5 g/dL (ref 30.0–36.0)
MCV: 90.6 fL (ref 80.0–100.0)
Platelets: 195 10*3/uL (ref 150–400)
RBC: 4.91 MIL/uL (ref 4.22–5.81)
RDW: 11.7 % (ref 11.5–15.5)
WBC: 5.9 10*3/uL (ref 4.0–10.5)
nRBC: 0 % (ref 0.0–0.2)

## 2021-12-09 LAB — URINALYSIS, ROUTINE W REFLEX MICROSCOPIC
Bilirubin Urine: NEGATIVE
Glucose, UA: NEGATIVE mg/dL
Hgb urine dipstick: NEGATIVE
Ketones, ur: NEGATIVE mg/dL
Leukocytes,Ua: NEGATIVE
Nitrite: NEGATIVE
Protein, ur: NEGATIVE mg/dL
Specific Gravity, Urine: <1.005 — ABNORMAL LOW (ref 1.005–1.030)
pH: 5.5 (ref 5.0–8.0)

## 2021-12-09 LAB — LIPASE, BLOOD: Lipase: 16 U/L (ref 11–51)

## 2021-12-09 NOTE — ED Triage Notes (Signed)
Pt presents to ED from home C/O LLQ abdominal pain X several weeks. Pt denies n/v/d.

## 2021-12-09 NOTE — ED Provider Notes (Signed)
[facility]   CSN: [id number] Arrival date & time: 12/09/21  1738     History  Chief Complaint  Patient presents with   Abdominal Pain    Johnny Wang P Wang is a 41 y.o. male.  41 year old male presents today for evaluation of left-sided abdominal pain of about 3-week duration.  This is not associated with nausea, vomiting, dysuria, fever, diarrhea.  He does state he has longstanding constipation with straining.  Denies any known injury.  Reports good appetite.   The history is provided by the patient and the spouse. No language interpreter was used.      Home Medications Prior to Admission medications   Medication Sig Start Date End Date Taking? Authorizing Provider  levothyroxine (SYNTHROID, LEVOTHROID) 25 MCG tablet Take 25 mcg by mouth daily before breakfast.  08/13/13   [provider]  LORazepam (ATIVAN) 0.5 MG tablet Take one daily as needed for anxiety Patient taking differently: 0.25 mg every 4 (four) hours as needed. Take one daily as needed for anxiety 11/02/14   Myrlene Brokeross, Deborah R, MD  methocarbamol (ROBAXIN) 500 MG tablet Take 1 tablet (500 mg total) by mouth 2 (two) times daily. 12/31/19   Gailen ShelterFondaw, Wylder S, PA      Allergies    Solu-medrol [methylprednisolone sodium succ]    Review of Systems   Review of Systems  Constitutional:  Negative for appetite change, chills and fever.  Respiratory:  Negative for shortness of breath.   Gastrointestinal:  Positive for abdominal pain and constipation. Negative for nausea and vomiting.  Genitourinary:  Negative for difficulty urinating and dysuria.  Neurological:  Negative for light-headedness.  All other systems reviewed and are negative.  Physical Exam Updated Vital Signs BP 119/79 (BP Location: Right Arm)   Pulse 80   Temp 98 F (36.7 C) (Oral)   Resp 18   Ht 6\' 2"  (1.88 m)   Wt 91.2 kg   SpO2 100%   BMI 25.81 kg/m  Physical Exam Vitals and nursing note reviewed.   Constitutional:      General: He is not in acute distress.    Appearance: Normal appearance. He is not ill-appearing.  HENT:     Head: Normocephalic and atraumatic.     Nose: Nose normal.  Eyes:     General: No scleral icterus.    Extraocular Movements: Extraocular movements intact.     Conjunctiva/sclera: Conjunctivae normal.  Cardiovascular:     Rate and Rhythm: Normal rate and regular rhythm.     Pulses: Normal pulses.     Heart sounds: Normal heart sounds.  Pulmonary:     Effort: Pulmonary effort is normal. No respiratory distress.     Breath sounds: Normal breath sounds. No wheezing or rales.  Abdominal:     General: There is no distension.     Palpations: Abdomen is soft.     Tenderness: There is no abdominal tenderness. There is no right CVA tenderness, left CVA tenderness or guarding.  Musculoskeletal:        General: Normal range of motion.     Cervical back: Normal range of motion.  Skin:    General: Skin is warm and dry.  Neurological:     General: No focal deficit present.     Mental Status: He is alert. Mental status is at baseline.    ED Results / Procedures / Treatments   Labs (all labs ordered are listed, but only abnormal results are displayed) Labs Reviewed  COMPREHENSIVE METABOLIC PANEL - Abnormal; Notable for the following components:      Result Value   Glucose, Bld 103 (*)    All other components within normal limits  URINALYSIS, ROUTINE W REFLEX MICROSCOPIC - Abnormal; Notable for the following components:   Color, Urine COLORLESS (*)    Specific Gravity, Urine <1.005 (*)    All other components within normal limits  LIPASE, BLOOD  CBC    EKG None  Radiology No results found.  Procedures Procedures    Medications Ordered in ED Medications - No data to display  ED Course/ Medical Decision Making/ A&P Clinical Course as of 12/09/21 2155  Fri Dec 09, 2021  2140 CT Renal [name] [AA]    Clinical Course User Index [AA] Marita Kansas, PA-C                           Medical Decision Making Amount and/or Complexity of Data Reviewed Labs: ordered. Radiology: ordered. Decision-making details documented in ED Course.   Medical Decision Making / ED Course   This patient presents to the ED for concern of abdominal pain, this involves an extensive number of treatment options, and is a complaint that carries with it a high risk of complications and morbidity.  The differential diagnosis includes nephrolithiasis, pyelonephritis, diverticulitis, pancreatitis, appendicitis, cholecystitis, gastroenteritis  MDM: Abdominal exam without concern for acute abdomen.  Patient is well-appearing.  CBC without leukocytosis, or anemia.  Lipase within normal limits.  CMP with glucose 103 otherwise unremarkable.  UA without signs of UTI, or hemoglobin.  Patient does endorse daily drinking for multiple years.  States at times pain is relatively severe.  Will evaluate with CT renal stone study to ensure no kidney stone, or other acute intra-abdominal process. CT without evidence of kidney stone, or other acute intra-abdominal process.  Does note moderate stool burden.  Patient symptoms likely secondary to constipation.  Discussed aggressive bowel regimen and follow-up with PCP.  Patient and his wife both voiced understanding and are in agreement with plan.  Return precautions discussed.  Patient discharged in stable condition.   Lab Tests: -I ordered, reviewed, and interpreted labs.   The pertinent results include:   Labs Reviewed  COMPREHENSIVE METABOLIC PANEL - Abnormal; Notable for the following components:      Result Value   Glucose, Bld 103 (*)    All other components within normal limits  URINALYSIS, ROUTINE W REFLEX MICROSCOPIC - Abnormal; Notable for the following components:   Color, Urine COLORLESS (*)    Specific Gravity, Urine <1.005 (*)    All other components within normal limits  LIPASE, BLOOD  CBC      EKG  EKG  Interpretation  Date/Time:    Ventricular Rate:    PR Interval:    QRS Duration:   QT Interval:    QTC Calculation:   R Axis:     Text Interpretation:           Imaging Studies ordered: I ordered imaging studies including CT renal stone study I independently visualized and interpreted imaging. I agree with the radiologist interpretation   Medicines ordered and prescription drug management: No orders of the defined types were placed in this encounter.   -I have reviewed the patients home medicines and have made adjustments as needed  Reevaluation: After the interventions noted above, I reevaluated the patient and found that they have :improved  Co morbidities that complicate the patient  evaluation  Past Medical History:  Diagnosis Date   Anxiety    Hashimoto's disease    Hiatal hernia    Hypogonadism, male    Hypothyroidism       Dispostion: Patient is appropriate for discharge.  Discharged in stable condition.  Return precautions discussed.  Final Clinical Impression(s) / ED Diagnoses Final diagnoses:  Left lower quadrant abdominal pain    Rx / DC Orders ED Discharge Orders     None         Marita Kansas, PA-C 12/09/21 2156    Terald Sleeper, MD 12/10/21 [phone]

## 2021-12-09 NOTE — Discharge Instructions (Signed)
Your work-up today is reassuring.  No signs of concerning findings of your abdominal pain.  CT scan did show a moderate stool burden.  I recommend aggressive bowel regimen.  We did discuss doing 4 capfuls of MiraLAX as well as Senokot followed by daily fiber supplement for long-term bowel regiment.  He stated you have an alternative bowel regimen.  You could follow that as long as you achieve good results.  Otherwise follow-up with your PCP if you have any concerning signs or symptoms such as worsening abdominal pain, nausea vomiting to the point you are unable to keep any food or drink down, or fever without any apparent source please return to the emergency room for evaluation.

## 2022-12-22 IMAGING — CT CT RENAL STONE PROTOCOL
2 of 4 series · 16 of 46 positions shown, 18 images · non-contrast
Comparison: None Available.

CLINICAL DATA: Flank pain, kidney stone suspected

Left lower quadrant abdominal pain for several weeks, recently
worsened.
EXAM:
CT ABDOMEN AND PELVIS WITHOUT CONTRAST
TECHNIQUE: Multidetector CT imaging of the abdomen and pelvis was performed
following the standard protocol without IV contrast.
RADIATION DOSE REDUCTION: This exam was performed according to the
departmental dose-optimization program which includes automated
exposure control, adjustment of the mA and/or kV according to
patient size and/or use of iterative reconstruction technique.

[Series 2: stone full · axial · 0.77mm/px · z∈[-409,-4]mm · 13 of 89 slices shown, 15 images]
[im 4/89  soft-tissue]
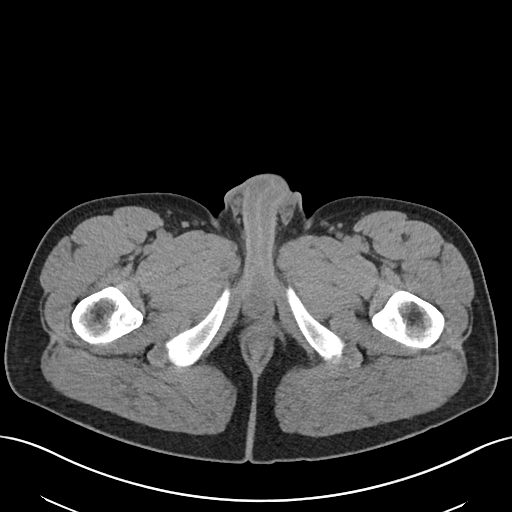
[im 4/89  bone]
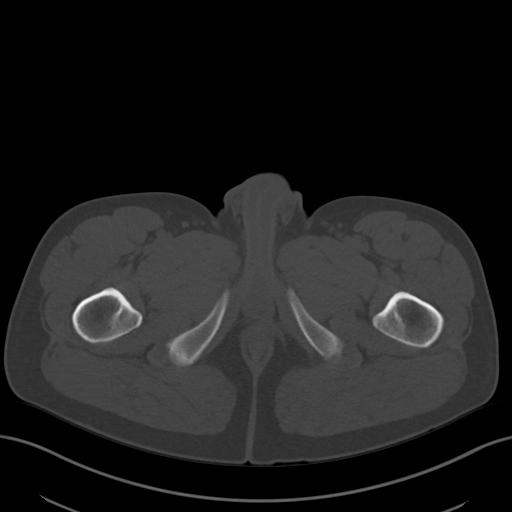
[im 11/89  soft-tissue]
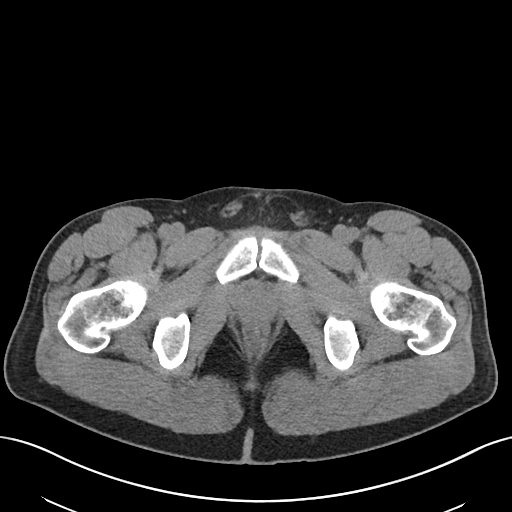
[im 18/89  soft-tissue]
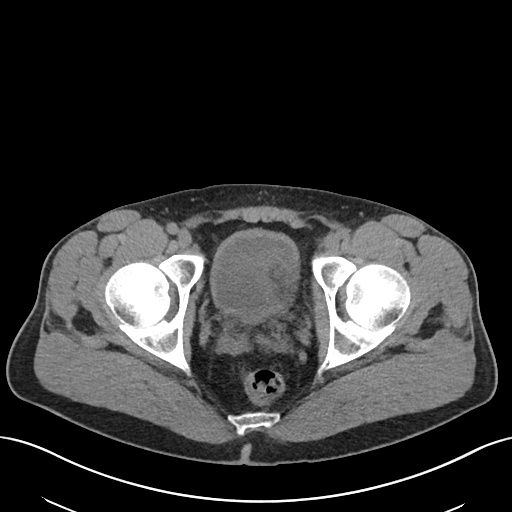
[im 25/89  soft-tissue]
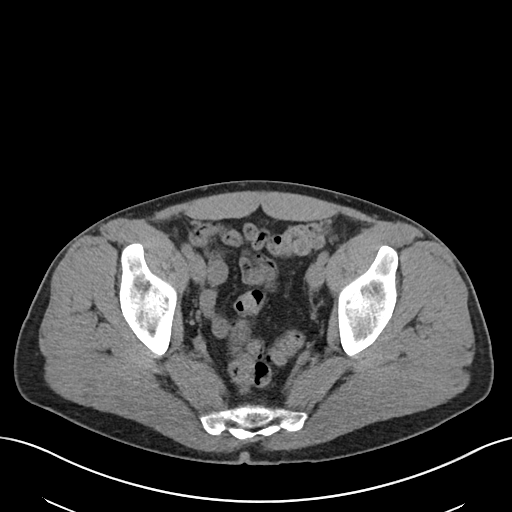
[im 32/89  soft-tissue]
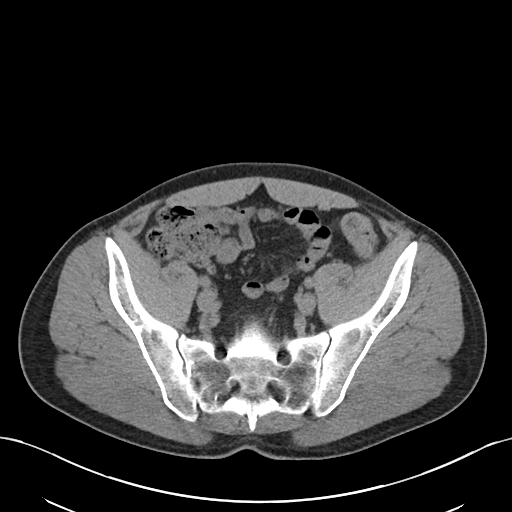
[im 39/89  soft-tissue]
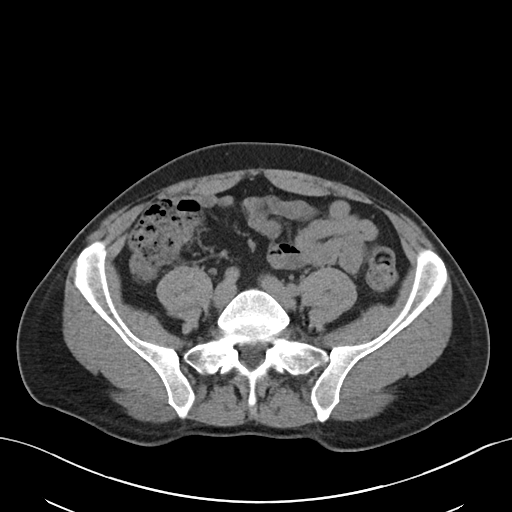
[im 46/89  soft-tissue]
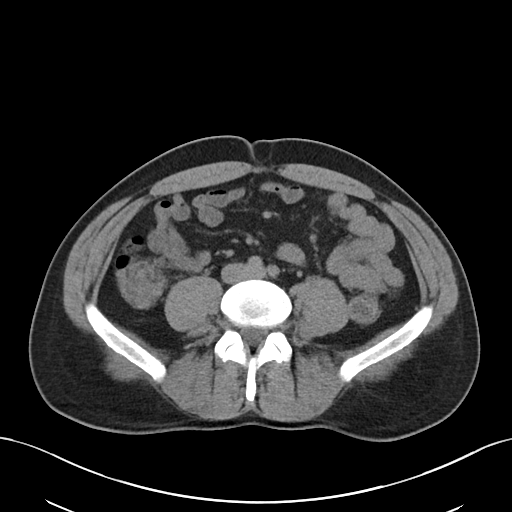
[im 50/89  soft-tissue]
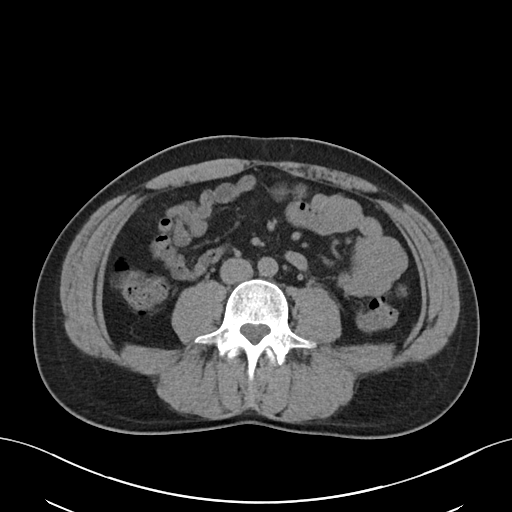
[im 57/89  soft-tissue]
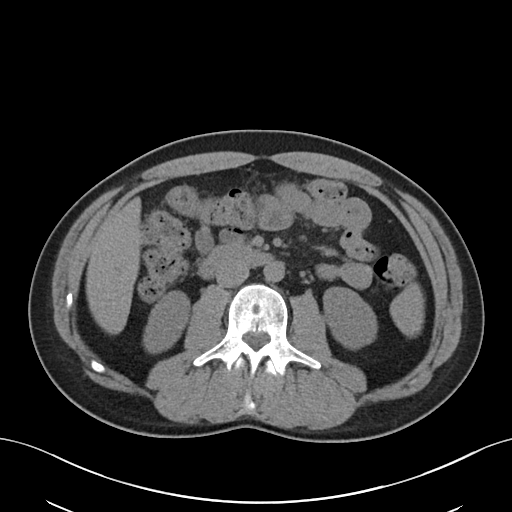
[im 57/89  bone]
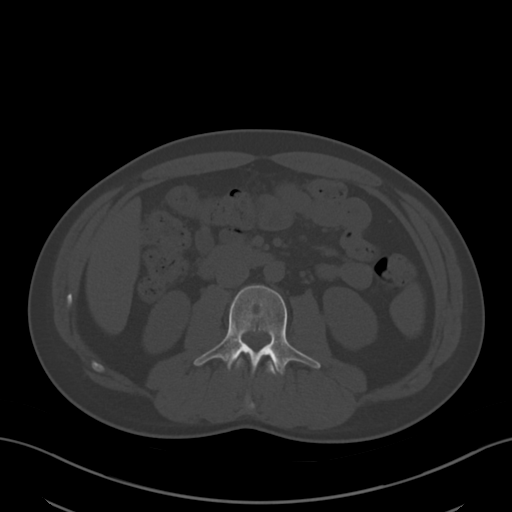
[im 64/89  soft-tissue]
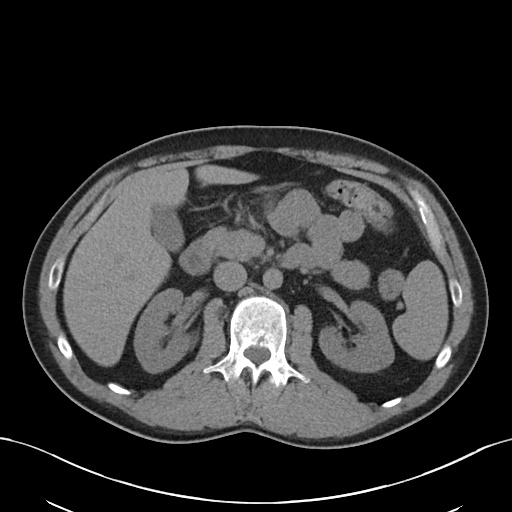
[im 71/89  soft-tissue]
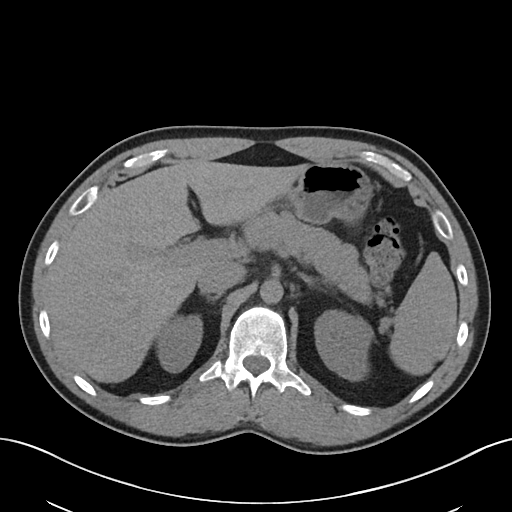
[im 78/89  soft-tissue]
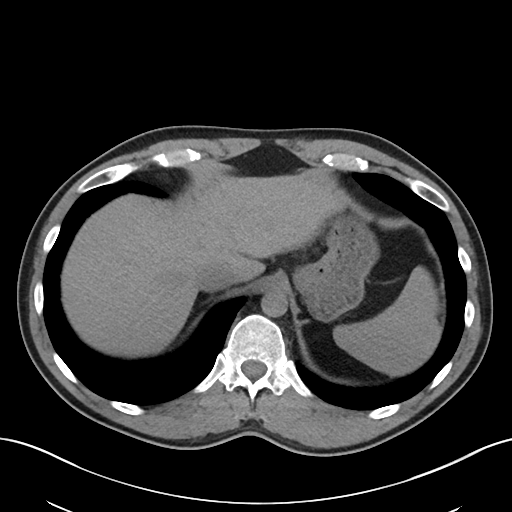
[im 85/89  soft-tissue]
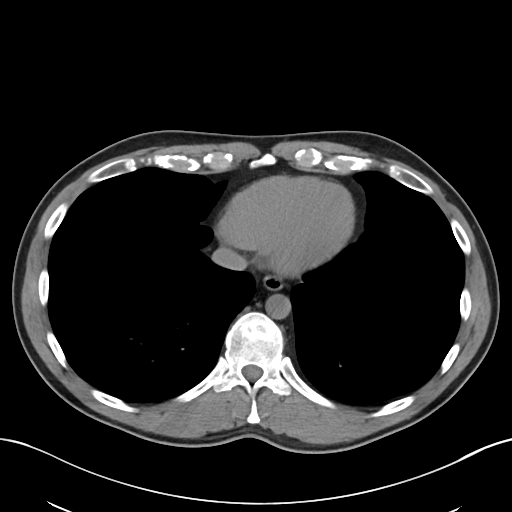

[Series 5: coronal · coronal · 0.76mm/px · 3 of 89 slices shown]
[im 30/89  soft-tissue]
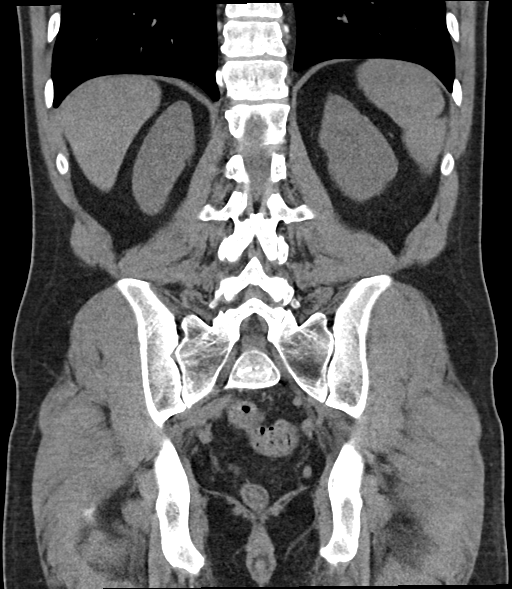
[im 40/89  soft-tissue]
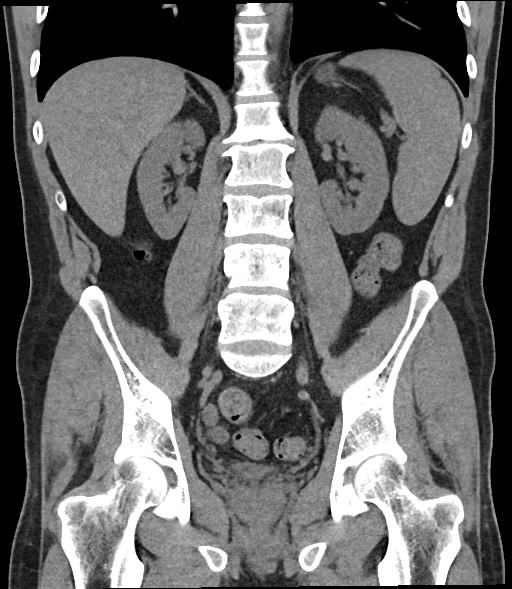
[im 49/89  soft-tissue]
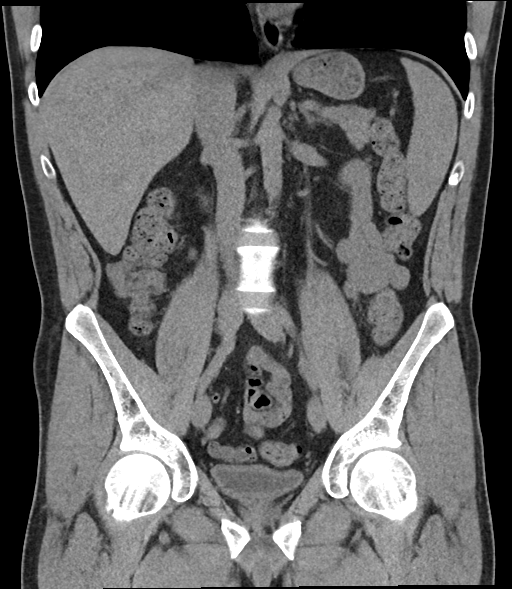

[16 of 46 positions shown; findings below may reference images not displayed]

FINDINGS: Lower chest: No acute airspace disease.  No pleural fluid.

Hepatobiliary: 9 mm subcapsular low-density in the right hepatic
lobe, series 2, image 24, too small to characterize. Gallbladder
physiologically distended, no calcified stone. No biliary
dilatation.

Pancreas: Unremarkable. No pancreatic ductal dilatation or
surrounding inflammatory changes.

Spleen: Normal in size without focal abnormality.

Adrenals/Urinary Tract: Normal adrenal glands. No hydronephrosis or
renal calculi. Decompressed ureters without ureteral stone. No
evidence of focal renal abnormality on this unenhanced exam. No
perinephric edema. Minimally distended urinary bladder, no bladder
stone or wall thickening.

Stomach/Bowel: Unremarkable appearance of the stomach. There is no
small bowel obstruction or inflammation. Mild fecalization of distal
small bowel contents. Moderate colonic stool burden without colonic
wall thickening or inflammation. Normal appendix.

Vascular/Lymphatic: Normal caliber abdominal aorta. Minimal iliac
atherosclerosis. No abdominopelvic adenopathy.

Reproductive: Prostate is unremarkable.

Other: No free air or free fluid. Small fat containing umbilical
hernia, no inguinal hernia.

Musculoskeletal: There are no acute or suspicious osseous
abnormalities. Small hemangioma within L3 vertebral body.
IMPRESSION: 1. No renal stones or obstructive uropathy.
2. Moderate colonic stool burden with fecalization of distal small
bowel contents, suggesting slow transit. No bowel obstruction or
inflammation.
3. Small fat containing umbilical hernia.
